# Patient Record
Sex: Female | Born: 1990 | Race: Black or African American | Hispanic: No | Marital: Single | Smoking: Never smoker
Health system: Southern US, Community
[De-identification: ages and names within clinical notes are randomized; demographics above are authoritative.]

## PROBLEM LIST (undated history)

## (undated) ENCOUNTER — Emergency Department (HOSPITAL_COMMUNITY): Admission: EM | Payer: Self-pay | Source: Home / Self Care

## (undated) DIAGNOSIS — R87629 Unspecified abnormal cytological findings in specimens from vagina: Secondary | ICD-10-CM

## (undated) DIAGNOSIS — R51 Headache: Secondary | ICD-10-CM

## (undated) HISTORY — PX: COLPOSCOPY: SHX161

## (undated) HISTORY — DX: Unspecified abnormal cytological findings in specimens from vagina: R87.629

## (undated) HISTORY — DX: Headache: R51

---
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
## 2012-01-01 ENCOUNTER — Ambulatory Visit (INDEPENDENT_AMBULATORY_CARE_PROVIDER_SITE_OTHER): Payer: BC Managed Care – PPO | Admitting: Medical

## 2012-01-01 ENCOUNTER — Encounter: Payer: Self-pay | Admitting: Medical

## 2012-01-01 VITALS — BP 100/70 | HR 60 | Temp 99.1°F | Resp 16 | Ht 63.5 in | Wt 123.0 lb

## 2012-01-01 DIAGNOSIS — Z7189 Other specified counseling: Secondary | ICD-10-CM

## 2012-01-01 DIAGNOSIS — Z23 Encounter for immunization: Secondary | ICD-10-CM

## 2012-01-01 NOTE — Progress Notes (Signed)
Subjective: Here as a new patient.  Here for Tdap shot.  Starting [employer] this fall, needs Tdap.  Mother works at a rest home, dad works with [employer].  She notes no prior primary care, no recent physical.  She has been doing well in her normal state of health.  She currently eats a lot of fast food.  She lives with her parents, and they eat out mostly, but do cook some at home which is usually fried foods.  She is not exercising.  She does report 1 sexual partner in 2011, not sexually active now.  No prior STD screening.  She did bring her vaccine records with her.  She has no other c/o.    Past Medical History  Diagnosis Date  . Headache      The following portions of the patient's history were reviewed and updated as appropriate: allergies, current medications, past family history, past medical history, past social history, past surgical history and problem list.    Allergies  Allergen Reactions  . Shellfish Allergy     hives   Review of Systems ROS reviewed and was negative other than noted in HPI or above.    Objective:   Physical Exam  General appearance: alert, no distress, WD/WN, AA female Neck: supple, no lymphadenopathy, no thyromegaly, no masses Heart: RRR, normal S1, S2, no murmurs Lungs: CTA bilaterally, no wheezes, rhonchi, or rales Pulses: 2+ symmetric, upper and lower extremities, normal cap refill   Assessment and Plan :     Encounter Diagnoses  Name Primary?  . Need for Tdap vaccination Yes  . Counseling on health promotion and disease prevention     Vaccine counseling and VIS given, Tdap given.  Vaccine info faxed to [facility] student health.  Recommended a physical, advised she exercise regularly, needs to eat healthier, discussed preventative care, STD screening, HPV and Varicella # 2 vaccines recommended.  She will return at her convenience, wants to consult with her father first on these issues.

## 2012-01-01 NOTE — Patient Instructions (Signed)
I have reviewed your vaccines.  You are up to date on all vaccines except Tdap/tetanus, HPV - human papilloma virus vaccine and your second varicella/chicken pox vaccine.    We gave you Tdap today, but consider the HPV and varicella vaccines.    Consider returning for a physical.   I recommend you find out what medical problems your family has to help guide your health.    Eat healthy, cook more of your food at home, exercise regularly, and live a healthy lifestyle.

## 2012-01-03 ENCOUNTER — Telehealth: Payer: Self-pay | Admitting: Medical

## 2012-01-03 NOTE — Telephone Encounter (Signed)
Patient called and requested to have copy of Tdap vaccine faxed to [facility] @ [phone] and include her student ID # of [id number] Fax sent

## 2013-03-09 ENCOUNTER — Encounter (HOSPITAL_COMMUNITY): Payer: Self-pay | Admitting: Emergency Medicine

## 2013-03-09 ENCOUNTER — Emergency Department (HOSPITAL_COMMUNITY)
Admission: EM | Admit: 2013-03-09 | Discharge: 2013-03-09 | Disposition: A | Discharge status: Home / Self Care | Payer: BC Managed Care – PPO | Attending: Emergency Medicine | Admitting: Emergency Medicine

## 2013-03-09 ENCOUNTER — Emergency Department (HOSPITAL_COMMUNITY): Payer: BC Managed Care – PPO

## 2013-03-09 DIAGNOSIS — S93501A Unspecified sprain of right great toe, initial encounter: Secondary | ICD-10-CM

## 2013-03-09 DIAGNOSIS — Y9389 Activity, other specified: Secondary | ICD-10-CM | POA: Insufficient documentation

## 2013-03-09 DIAGNOSIS — Y9289 Other specified places as the place of occurrence of the external cause: Secondary | ICD-10-CM | POA: Insufficient documentation

## 2013-03-09 DIAGNOSIS — Y990 Civilian activity done for income or pay: Secondary | ICD-10-CM | POA: Insufficient documentation

## 2013-03-09 DIAGNOSIS — W208XXA Other cause of strike by thrown, projected or falling object, initial encounter: Secondary | ICD-10-CM | POA: Insufficient documentation

## 2013-03-09 DIAGNOSIS — W010XXA Fall on same level from slipping, tripping and stumbling without subsequent striking against object, initial encounter: Secondary | ICD-10-CM | POA: Insufficient documentation

## 2013-03-09 DIAGNOSIS — S93609A Unspecified sprain of unspecified foot, initial encounter: Secondary | ICD-10-CM | POA: Insufficient documentation

## 2013-03-09 MED ORDER — IBUPROFEN 400 MG PO TABS
800.0000 mg | ORAL_TABLET | Freq: Once | ORAL | Status: AC
  Administered 2013-03-09: 800 mg via ORAL
  Filled 2013-03-09: qty 2

## 2013-03-09 NOTE — ED Provider Notes (Signed)
CSN: [id number]     Arrival date & time 03/09/13  0028 History   None    Chief Complaint  Patient presents with  . Toe Injury   (Consider location/radiation/quality/duration/timing/severity/associated sxs/prior Treatment) HPI History provided by pt.   Pt slipped in the cooler at work this evening and dropped a case of soda on R great toe just pta.  Has severe pain that is aggravated by ROM.  Has not attempted to bear weight.  Has not taken anything for pain.  Did not hit her head when she fell and denies pain in neck/back.  Past Medical History  Diagnosis Date  . Headache(784.0)    No past surgical history on file. No family history on file. History  Substance Use Topics  . Smoking status: Never Smoker   . Smokeless tobacco: Not on file  . Alcohol Use: No   OB History   Grav Para Term Preterm Abortions TAB SAB Ect Mult Living                 Review of Systems  All other systems reviewed and are negative.    Allergies  Shellfish allergy  Home Medications  No current outpatient prescriptions on file. BP 115/79  Pulse 65  Temp(Src) 99.2 F (37.3 C) (Oral)  Resp 18  SpO2 100% Physical Exam  Nursing note and vitals reviewed. Constitutional: She is oriented to person, place, and time. She appears well-developed and well-nourished. No distress.  HENT:  Head: Normocephalic and atraumatic.  Eyes:  Normal appearance  Neck: Normal range of motion.  Pulmonary/Chest: Effort normal.  Musculoskeletal: Normal range of motion.  R great toe w/out deformity, edema or ecchymosis.  Nml nail. Tenderness of entire toe and pain w/ passive ROM MTP and interphalangeal joints.  Brisk cap refill and distal sensation intact.  Nml foot and rest of toes.  Neurological: She is alert and oriented to person, place, and time.  Psychiatric: She has a normal mood and affect. Her behavior is normal.    ED Course  Procedures (including critical care time) Labs Review Labs Reviewed - No data  to display Imaging Review Dg Toe Great Right  03/09/2013   CLINICAL DATA:  Toe injury  EXAM: RIGHT GREAT TOE  COMPARISON:  None.  FINDINGS: There is no evidence of fracture or dislocation. There is no evidence of arthropathy or other focal bone abnormality. Soft tissues are unremarkable.  IMPRESSION: Negative.   Electronically Signed   By: Tiburcio Pea M.D.   On: 03/09/2013 01:51    EKG Interpretation   None       MDM   1. Sprain of right great toe, initial encounter    Healthy 22yo F presents w/ R great toe injury.  Xray negative and NV intact on exam.  Nursing staff buddy-taped and provided her w/ post-op shoe.  2:07 AM     Otilio Miu, PA-C 03/09/13 0207  Otilio Miu, PA-C 03/09/13 [id number]

## 2013-03-09 NOTE — ED Notes (Signed)
Pt dropped a case of mountain dew on her toe. Toe swollen. Sensation present. Cap refill less than 3 seconds.

## 2013-03-09 NOTE — ED Notes (Signed)
Ice pack placed on right big toe

## 2013-03-09 NOTE — ED Notes (Signed)
Pt dc to home. Pt sts understanding to dc instructions. Pt ambulatory to exit without difficulty.  Pt denies need for w/c.  

## 2013-03-09 NOTE — ED Provider Notes (Signed)
Medical screening examination/treatment/procedure(s) were performed by non-physician practitioner and as supervising physician I was immediately available for consultation/collaboration.   Dione Booze, MD 03/09/13 [phone]

## 2013-10-09 ENCOUNTER — Encounter: Payer: Self-pay | Admitting: Obstetrics & Gynecology

## 2013-10-09 ENCOUNTER — Ambulatory Visit (INDEPENDENT_AMBULATORY_CARE_PROVIDER_SITE_OTHER): Payer: BC Managed Care – PPO | Admitting: Obstetrics & Gynecology

## 2013-10-09 VITALS — BP 93/57 | HR 57 | Ht 65.0 in | Wt 128.0 lb

## 2013-10-09 DIAGNOSIS — Z23 Encounter for immunization: Secondary | ICD-10-CM

## 2013-10-09 DIAGNOSIS — Z0000 Encounter for general adult medical examination without abnormal findings: Secondary | ICD-10-CM

## 2013-10-09 DIAGNOSIS — Z113 Encounter for screening for infections with a predominantly sexual mode of transmission: Secondary | ICD-10-CM

## 2013-10-09 DIAGNOSIS — Z3009 Encounter for other general counseling and advice on contraception: Secondary | ICD-10-CM

## 2013-10-09 MED ORDER — NORGESTIMATE-ETH ESTRADIOL 0.25-35 MG-MCG PO TABS: 1.0000 | ORAL_TABLET | Freq: Every day | ORAL | Status: DC

## 2013-10-09 NOTE — Progress Notes (Signed)
   Subjective:    Patient ID: Alyssa Long, female    DOB: [birth year], 23 y.o.   MRN: [id number]  HPI 3120 S AA G1P0A1 here today to discuss contraception. She lost virginity at 23 yo, 3 partner in lifetime. She denies h/o STIs.   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 She opts for OCPs. I prescribed generic sprintec. RTC 4 weeks for BP check

## 2013-10-10 LAB — RPR

## 2013-10-10 LAB — GC/CHLAMYDIA PROBE AMP
CT PROBE, AMP APTIMA: NEGATIVE
GC PROBE AMP APTIMA: NEGATIVE

## 2013-10-10 LAB — HEPATITIS C ANTIBODY: HCV AB: NEGATIVE

## 2013-10-10 LAB — HIV ANTIBODY (ROUTINE TESTING W REFLEX): HIV 1&2 Ab, 4th Generation: NONREACTIVE

## 2013-10-10 LAB — HEPATITIS B SURFACE ANTIGEN: HEP B S AG: NEGATIVE

## 2013-10-23 ENCOUNTER — Encounter: Payer: Self-pay | Admitting: Obstetrics & Gynecology

## 2013-11-12 ENCOUNTER — Encounter: Payer: BC Managed Care – PPO | Admitting: *Deleted

## 2013-11-12 ENCOUNTER — Encounter: Payer: Self-pay | Admitting: *Deleted

## 2013-11-12 NOTE — Progress Notes (Signed)
Pt came in today for a blood pressure reading due to starting OCP.  Patients blood pressure is good today.

## 2013-11-27 ENCOUNTER — Emergency Department (HOSPITAL_COMMUNITY)
Admission: EM | Admit: 2013-11-27 | Discharge: 2013-11-27 | Disposition: A | Discharge status: Home / Self Care | Payer: BC Managed Care – PPO | Attending: Emergency Medicine | Admitting: Emergency Medicine

## 2013-11-27 ENCOUNTER — Encounter (HOSPITAL_COMMUNITY): Payer: Self-pay | Admitting: Emergency Medicine

## 2013-11-27 DIAGNOSIS — H00016 Hordeolum externum left eye, unspecified eyelid: Secondary | ICD-10-CM

## 2013-11-27 DIAGNOSIS — H00019 Hordeolum externum unspecified eye, unspecified eyelid: Secondary | ICD-10-CM | POA: Insufficient documentation

## 2013-11-27 DIAGNOSIS — Z79899 Other long term (current) drug therapy: Secondary | ICD-10-CM | POA: Insufficient documentation

## 2013-11-27 MED ORDER — FLUORESCEIN SODIUM 1 MG OP STRP
1.0000 | ORAL_STRIP | Freq: Once | OPHTHALMIC | Status: AC
  Administered 2013-11-27: 21:00:00 via OPHTHALMIC
  Filled 2013-11-27: qty 1

## 2013-11-27 MED ORDER — TETRACAINE HCL 0.5 % OP SOLN
1.0000 [drp] | Freq: Once | OPHTHALMIC | Status: AC
  Administered 2013-11-27: 1 [drp] via OPHTHALMIC
  Filled 2013-11-27: qty 2

## 2013-11-27 MED ORDER — ERYTHROMYCIN 5 MG/GM OP OINT
1.0000 "application " | TOPICAL_OINTMENT | Freq: Once | OPHTHALMIC | Status: AC
  Administered 2013-11-27: 1 via OPHTHALMIC
  Filled 2013-11-27: qty 1

## 2013-11-27 NOTE — ED Notes (Signed)
Patient states during visual acuity that she is suppose to have eye glasses but does not have any. PA made aware.

## 2013-11-27 NOTE — ED Notes (Signed)
Pt with "bump" to inside of left lower eyelid since Tuesday. Denies blurred vision. Pt reports pain only when blinking.

## 2013-11-27 NOTE — ED Notes (Signed)
Patient was discharged with all personal items. 

## 2013-11-27 NOTE — ED Provider Notes (Signed)
CSN: [id number]     Arrival date & time 11/27/13  2005 History   None    This chart was scribed for non-physician practitioner, Junious SilkHannah Traci Gafford PA-C, working with Ward GivensIva L Knapp, MD by Arlan OrganAshley Leger, ED Scribe. This patient was seen in room TR04C/TR04C and the patient's care was started at 9:15 PM.   Chief Complaint  Patient presents with  . Eye Problem   The history is provided by the patient. No language interpreter was used.    HPI Comments: Alyssa Long is a 23 y.o. female who presents to the Emergency Department complaining of moderate L eye swelling x 2 days that is unchanged. Pt also reports watering of the eye and mild pain. Discomfort is worsened with blinking. No alleviating factors at this time. She has not tried anything OTC or any home remedies to improve symptoms. At this time she denies any fever, chills, photophobia, eye drainage, visual disturbances, or eye itching. She has no pertinent past medical history. No other concerns this visit.   Past Medical History  Diagnosis Date  . Headache(784.0)    History reviewed. No pertinent past surgical history. History reviewed. No pertinent family history. History  Substance Use Topics  . Smoking status: Never Smoker   . Smokeless tobacco: Not on file  . Alcohol Use: No   OB History   Grav Para Term Preterm Abortions TAB SAB Ect Mult Living                 Review of Systems  Constitutional: Negative for fever and chills.  Eyes: Positive for pain (L eye). Negative for photophobia, discharge, redness, itching and visual disturbance.  All other systems reviewed and are negative.     Allergies  Shellfish allergy  Home Medications   Prior to Admission medications   Medication Sig Start Date End Date Taking? Authorizing Provider  norgestimate-ethinyl estradiol (ORTHO-CYCLEN,SPRINTEC,PREVIFEM) 0.25-35 MG-MCG tablet Take 1 tablet by mouth daily. 10/09/13   Allie BossierMyra C Dove, MD   Triage Vitals: BP 117/74  Pulse 82  Temp(Src)  99.1 F (37.3 C) (Oral)  Resp 16  SpO2 100%   Physical Exam  Nursing note and vitals reviewed. Constitutional: She is oriented to person, place, and time. She appears well-developed and well-nourished.  HENT:  Head: Normocephalic.  Eyes: Conjunctivae and EOM are normal. Pupils are equal, round, and reactive to light. Right eye exhibits no hordeolum. Left eye exhibits hordeolum. Left eye exhibits no discharge and no exudate. No foreign body present in the left eye. Right conjunctiva is not injected. Right conjunctiva has no hemorrhage. Left conjunctiva is not injected. Left conjunctiva has no hemorrhage.  Slit lamp exam:      The left eye shows no corneal abrasion, no corneal flare, no corneal ulcer, no foreign body, no hyphema, no hypopyon and no fluorescein uptake.  Stye to lower, inner eyelid  Neck: Normal range of motion.  Cardiovascular: Normal rate, regular rhythm and normal heart sounds.   Pulmonary/Chest: Effort normal.  Abdominal: She exhibits no distension.  Musculoskeletal: Normal range of motion.  Neurological: She is alert and oriented to person, place, and time.  Psychiatric: She has a normal mood and affect.    ED Course  Procedures (including critical care time)  DIAGNOSTIC STUDIES: Oxygen Saturation is 100% on RA, Normal by my interpretation.    COORDINATION OF CARE: 9:17 PM- Will give an antibiotic ointment at discharge. Discussed treatment plan with pt at bedside and pt agreed to plan.  Labs Review Labs Reviewed - No data to display  Imaging Review No results found.   EKG Interpretation None      MDM   Final diagnoses:  Stye, left   Patient presents to ED with stye to left inner lid. Visual acuity equal bilaterally. No fluorescein dye uptake. Patient will be discharged with erythromycin ointment and opthalmology follow up if her symptoms worsen in any way. Encouraged to use warm compresses on eye. Discussed reasons to return to ED immediately.  Vital signs stable for discharge. Patient / Family / Caregiver informed of clinical course, understand medical decision-making process, and agree with plan.  I personally performed the services described in this documentation, which was scribed in my presence. The recorded information has been reviewed and is accurate.    Mora Bellman, PA-C 11/29/13 0236

## 2013-11-27 NOTE — Discharge Instructions (Signed)

## 2013-12-02 NOTE — ED Provider Notes (Signed)
Medical screening examination/treatment/procedure(s) were performed by non-physician practitioner and as supervising physician I was immediately available for consultation/collaboration.   EKG Interpretation None      Devoria AlbeIva Mikael Skoda, MD, Armando GangFACEP   Ward GivensIva L Claudius Mich, MD 12/02/13 2042

## 2013-12-03 NOTE — Progress Notes (Signed)
This encounter was created in error - please disregard.

## 2013-12-23 ENCOUNTER — Encounter (HOSPITAL_COMMUNITY): Payer: Self-pay | Admitting: Emergency Medicine

## 2013-12-23 ENCOUNTER — Emergency Department (HOSPITAL_COMMUNITY)
Admission: EM | Admit: 2013-12-23 | Discharge: 2013-12-23 | Disposition: A | Discharge status: Home / Self Care | Payer: BC Managed Care – PPO | Attending: Emergency Medicine | Admitting: Emergency Medicine

## 2013-12-23 DIAGNOSIS — L03119 Cellulitis of unspecified part of limb: Principal | ICD-10-CM

## 2013-12-23 DIAGNOSIS — L02419 Cutaneous abscess of limb, unspecified: Secondary | ICD-10-CM | POA: Insufficient documentation

## 2013-12-23 DIAGNOSIS — L0291 Cutaneous abscess, unspecified: Secondary | ICD-10-CM

## 2013-12-23 DIAGNOSIS — L989 Disorder of the skin and subcutaneous tissue, unspecified: Secondary | ICD-10-CM | POA: Insufficient documentation

## 2013-12-23 NOTE — ED Provider Notes (Signed)
CSN: [id number]     Arrival date & time 12/23/13  2020 History  This chart was scribed for non-physician practitioner, Earley FavorGail Kerah Hardebeck, FNP,working with Rolland PorterMark James, MD, by Karle PlumberJennifer Tensley, ED Scribe. This patient was seen in room WTR6/WTR6 and the patient's care was started at 9:20 PM.  Chief Complaint  Patient presents with  . Skin Problem   The history is provided by the patient. No language interpreter was used.   HPI Comments:  Alyssa Long is a 23 y.o. female who presents to the Emergency Department complaining of a raised area to her right inner thigh that she noticed three days ago. She reports sore throat, generalized muscle aches and subjective fever that have since resolved since onset three days ago. She states she has not taken anything for pain or done anything for her symptoms. Pt reports h/o previous abscesses that have had to be incised and drained in the past. She denies drainage, current fever, numbness or tingling. Pt denies allergies to any medications. She states she takes oral contraceptives.  History reviewed. No pertinent past medical history. History reviewed. No pertinent past surgical history. History reviewed. No pertinent family history. History  Substance Use Topics  . Smoking status: Never Smoker   . Smokeless tobacco: Not on file  . Alcohol Use: No   OB History   Grav Para Term Preterm Abortions TAB SAB Ect Mult Living                 Review of Systems  Constitutional: Negative for fever and chills.  Musculoskeletal: Negative for myalgias.  Skin:       Abscess to inner right thigh.  Neurological: Negative for numbness.  All other systems reviewed and are negative.   Allergies  Review of patient's allergies indicates no known allergies.  Home Medications   Prior to Admission medications   Not on File   Triage Vitals: BP 105/61  Pulse 80  Temp(Src) 99 F (37.2 C) (Oral)  Resp 16  SpO2 100%  LMP 12/16/2013 Physical Exam  Nursing note and  vitals reviewed. Constitutional: She is oriented to person, place, and time. She appears well-developed and well-nourished.  HENT:  Head: Normocephalic and atraumatic.  Eyes: EOM are normal.  Neck: Normal range of motion.  Cardiovascular: Normal rate.   Pulmonary/Chest: Effort normal.  Musculoskeletal: Normal range of motion.  Neurological: She is alert and oriented to person, place, and time.  Skin: Skin is warm and dry. There is erythema.  1 cm firm area with domed pustule with 2 cm of erythema without induration.  Psychiatric: She has a normal mood and affect. Her behavior is normal.    ED Course  Procedures (including critical care time) DIAGNOSTIC STUDIES: Oxygen Saturation is 100% on RA, normal by my interpretation.   COORDINATION OF CARE: 9:24 PM- Will I & D abscess. Pt verbalizes understanding and agrees to plan.  INCISION AND DRAINAGE PROCEDURE NOTE: Patient identification was confirmed and verbal consent was obtained. This procedure was performed by Earley FavorGail Beck Cofer, FNP at 9:26 PM. Site: right medial thigh Sterile procedures observed Needle size: 27 G Anesthetic used (type and amt): Lidocaine 2% with Epinephrine (2 mLs) Blade size: 11 Drainage: small  Complexity: Complex Packing used: none Site anesthetized, incision made over site, wound drained and explored loculations, rinsed with copious amounts of normal saline, wound packed with sterile gauze, covered with dry, sterile dressing.  Pt tolerated procedure well without complications.  Instructions for care discussed verbally and pt provided with  additional written instructions for homecare and f/u.  Medications - No data to display  Labs Review Labs Reviewed - No data to display  Imaging Review No results found.   EKG Interpretation None      MDM   Final diagnoses:  Abscess      I personally performed the services described in this documentation, which was scribed in my presence. The recorded  information has been reviewed and is accurate.    Arman Filter, NP 12/23/13 2158

## 2013-12-23 NOTE — Discharge Instructions (Signed)

## 2013-12-23 NOTE — ED Notes (Signed)
Patient states she notice raised area to her right inner thigh. She denies drainage in the area, but she says she experience general muscle aches and fever.

## 2013-12-25 NOTE — ED Provider Notes (Signed)
Medical screening examination/treatment/procedure(s) were performed by non-physician practitioner and as supervising physician I was immediately available for consultation/collaboration.   EKG Interpretation None        Rolland PorterMark Motty Borin, MD 12/25/13 [phone]

## 2014-12-29 ENCOUNTER — Inpatient Hospital Stay (HOSPITAL_COMMUNITY)
Admission: AD | Admit: 2014-12-29 | Discharge: 2014-12-30 | Disposition: A | Discharge status: Home / Self Care | Payer: Self-pay | Source: Ambulatory Visit | Attending: Obstetrics & Gynecology | Admitting: Obstetrics & Gynecology

## 2014-12-29 ENCOUNTER — Encounter (HOSPITAL_COMMUNITY): Payer: Self-pay | Admitting: *Deleted

## 2014-12-29 DIAGNOSIS — B9689 Other specified bacterial agents as the cause of diseases classified elsewhere: Secondary | ICD-10-CM

## 2014-12-29 DIAGNOSIS — N760 Acute vaginitis: Secondary | ICD-10-CM | POA: Insufficient documentation

## 2014-12-29 DIAGNOSIS — Z3202 Encounter for pregnancy test, result negative: Secondary | ICD-10-CM | POA: Insufficient documentation

## 2014-12-29 LAB — URINALYSIS, ROUTINE W REFLEX MICROSCOPIC
BILIRUBIN URINE: NEGATIVE
Glucose, UA: NEGATIVE mg/dL
Hgb urine dipstick: NEGATIVE
Ketones, ur: NEGATIVE mg/dL
Nitrite: POSITIVE — AB
PH: 7 (ref 5.0–8.0)
PROTEIN: NEGATIVE mg/dL
SPECIFIC GRAVITY, URINE: 1.015 (ref 1.005–1.030)
Urobilinogen, UA: 1 mg/dL (ref 0.0–1.0)

## 2014-12-29 LAB — URINE MICROSCOPIC-ADD ON

## 2014-12-29 LAB — POCT PREGNANCY, URINE: Preg Test, Ur: NEGATIVE

## 2014-12-29 NOTE — MAU Note (Signed)
PT SAYS  SOMEONE  TOLD  HER   THAT  SHE  SMELT  BAD-  AND HAD VAG  ODOR.   SAYS SHE  HAD NL VAG  D/C.

## 2014-12-30 DIAGNOSIS — A499 Bacterial infection, unspecified: Secondary | ICD-10-CM

## 2014-12-30 DIAGNOSIS — N760 Acute vaginitis: Secondary | ICD-10-CM

## 2014-12-30 LAB — WET PREP, GENITAL
TRICH WET PREP: NONE SEEN
YEAST WET PREP: NONE SEEN

## 2014-12-30 LAB — GC/CHLAMYDIA PROBE AMP (~~LOC~~) NOT AT ARMC
Chlamydia: NEGATIVE
NEISSERIA GONORRHEA: NEGATIVE

## 2014-12-30 LAB — HIV ANTIBODY (ROUTINE TESTING W REFLEX): HIV SCREEN 4TH GENERATION: NONREACTIVE

## 2014-12-30 NOTE — MAU Provider Note (Signed)
Chief Complaint: No chief complaint on file.   None     SUBJECTIVE HPI: Alyssa Long is a 24 y.o. G2P0020  who presents to maternity admissions reporting vaginal odor.  She reports she does not notice an odor but friends have commented that she has an odor and should get checked out.  She denies abdominal pain, vaginal bleeding, vaginal itching/burning, urinary symptoms, h/a, dizziness, n/v, or fever/chills.     HPI  Past Medical History  Diagnosis Date  . Headache(784.0)    History reviewed. No pertinent past surgical history. Social History   Social History  . Marital Status: Single    Spouse Name: N/A  . Number of Children: N/A  . Years of Education: N/A   Occupational History  . Not on file.   Social History Main Topics  . Smoking status: Never Smoker   . Smokeless tobacco: Not on file  . Alcohol Use: 0.6 oz/week    1 Glasses of wine per week  . Drug Use: No  . Sexual Activity:    Partners: Male    Birth Control/ Protection: None   Other Topics Concern  . Not on file   Social History Narrative   No current facility-administered medications on file prior to encounter.   Current Outpatient Prescriptions on File Prior to Encounter  Medication Sig Dispense Refill  . norgestimate-ethinyl estradiol (ORTHO-CYCLEN,SPRINTEC,PREVIFEM) 0.25-35 MG-MCG tablet Take 1 tablet by mouth daily. 1 Package 11   Allergies  Allergen Reactions  . Shellfish Allergy     hives    ROS:  Review of Systems  Constitutional: Negative for fever, chills and fatigue.  HENT: Negative for sinus pressure.   Eyes: Negative for photophobia.  Respiratory: Negative for shortness of breath.   Cardiovascular: Negative for chest pain.  Gastrointestinal: Positive for nausea. Negative for vomiting and abdominal pain.  Genitourinary: Negative for dysuria, frequency, flank pain, vaginal bleeding, vaginal discharge, difficulty urinating, vaginal pain and pelvic pain.  Musculoskeletal: Negative  for neck pain.  Neurological: Positive for dizziness and headaches. Negative for weakness.  Psychiatric/Behavioral: Negative.      I have reviewed patient's Past Medical Hx, Surgical Hx, Family Hx, Social Hx, medications and allergies.   Physical Exam   Patient Vitals for the past 24 hrs:  BP Temp Temp src Pulse Resp Height Weight  12/29/14 2225 130/78 mmHg 98.3 F (36.8 C) Oral 72 20  (1.575 m) 57.323 kg (126 lb 6 oz)   Constitutional: Well-developed, well-nourished female in no acute distress.  Cardiovascular: normal rate Respiratory: normal effort GI: Abd soft, non-tender, gravid appropriate for gestational age. Pos BS x 4 MS: Extremities nontender, no edema, normal ROM Neurologic: Alert and oriented x 4.  GU: Neg CVAT.  PELVIC EXAM: Cervix pink, visually closed, friable cervix, without lesion, scant white thin discharge, vaginal walls and external genitalia normal Bimanual exam: Cervix 0/long/high, firm, anterior, neg CMT, uterus nontender, nonenlarged, adnexa without tenderness, enlargement, or mass   LAB RESULTS Results for orders placed or performed during the hospital encounter of 12/29/14 (from the past 24 hour(s))  Urinalysis, Routine w reflex microscopic (not at [facility])     Status: Abnormal   Collection Time: 12/29/14 10:30 PM  Result Value Ref Range   Color, Urine YELLOW YELLOW   APPearance CLEAR CLEAR   Specific Gravity, Urine 1.015 1.005 - 1.030   pH 7.0 5.0 - 8.0   Glucose, UA NEGATIVE NEGATIVE mg/dL   Hgb urine dipstick NEGATIVE NEGATIVE   Bilirubin Urine NEGATIVE  NEGATIVE   Ketones, ur NEGATIVE NEGATIVE mg/dL   Protein, ur NEGATIVE NEGATIVE mg/dL   Urobilinogen, UA 1.0 0.0 - 1.0 mg/dL   Nitrite POSITIVE (A) NEGATIVE   Leukocytes, UA SMALL (A) NEGATIVE  Urine microscopic-add on     Status: Abnormal   Collection Time: 12/29/14 10:30 PM  Result Value Ref Range   Squamous Epithelial / LPF FEW (A) RARE   WBC, UA 3-6 <3 WBC/hpf   RBC / HPF 0-2 <3 RBC/hpf    Bacteria, UA MANY (A) RARE  Pregnancy, urine POC     Status: None   Collection Time: 12/29/14 10:41 PM  Result Value Ref Range   Preg Test, Ur NEGATIVE NEGATIVE  Wet prep, genital     Status: Abnormal   Collection Time: 12/30/14 12:44 AM  Result Value Ref Range   Yeast Wet Prep HPF POC NONE SEEN NONE SEEN   Trich, Wet Prep NONE SEEN NONE SEEN   Clue Cells Wet Prep HPF POC FEW (A) NONE SEEN   WBC, Wet Prep HPF POC FEW (A) NONE SEEN      ASSESSMENT 1. Bacterial vaginosis     PLAN GC culture pending Urine culture pending Discharge home Flagyl 500 mg BID x 7 days Recommend routine Gyn care/Pap. Pt given contact information for providers. Return to MAU as needed for emergencies    Medication List    TAKE these medications        norgestimate-ethinyl estradiol 0.25-35 MG-MCG tablet  Commonly known as:  ORTHO-CYCLEN,SPRINTEC,PREVIFEM  Take 1 tablet by mouth daily.         Sharen Counter Certified Nurse-Midwife 12/30/2014  2:49 AM

## 2015-01-01 LAB — URINE CULTURE: Culture: >=100000

## 2015-01-02 ENCOUNTER — Telehealth: Payer: Self-pay | Admitting: Physician Assistant

## 2015-01-02 DIAGNOSIS — N390 Urinary tract infection, site not specified: Secondary | ICD-10-CM | POA: Insufficient documentation

## 2015-01-02 MED ORDER — CIPROFLOXACIN HCL 500 MG PO TABS: 500.0000 mg | ORAL_TABLET | Freq: Two times a day (BID) | ORAL | Status: DC

## 2015-01-02 NOTE — Telephone Encounter (Signed)
Urine culture results indicate UTI in non-pregnant female, sensitive to Cipro.   Pt called.  Identify confirmed x 2.  Pt informed of UTI and need for treatment.  She confirms no allergies.  Desires pharmacy changed to [facility] at [address].  Rx for Cipro sent.  All questions answered.

## 2015-02-24 IMAGING — CR DG TOE GREAT 2+V*R*
3 series · 3 of 3 positions shown · non-contrast
Comparison: None.

CLINICAL DATA: Toe injury

EXAM:
RIGHT GREAT TOE

[t toes ap right]
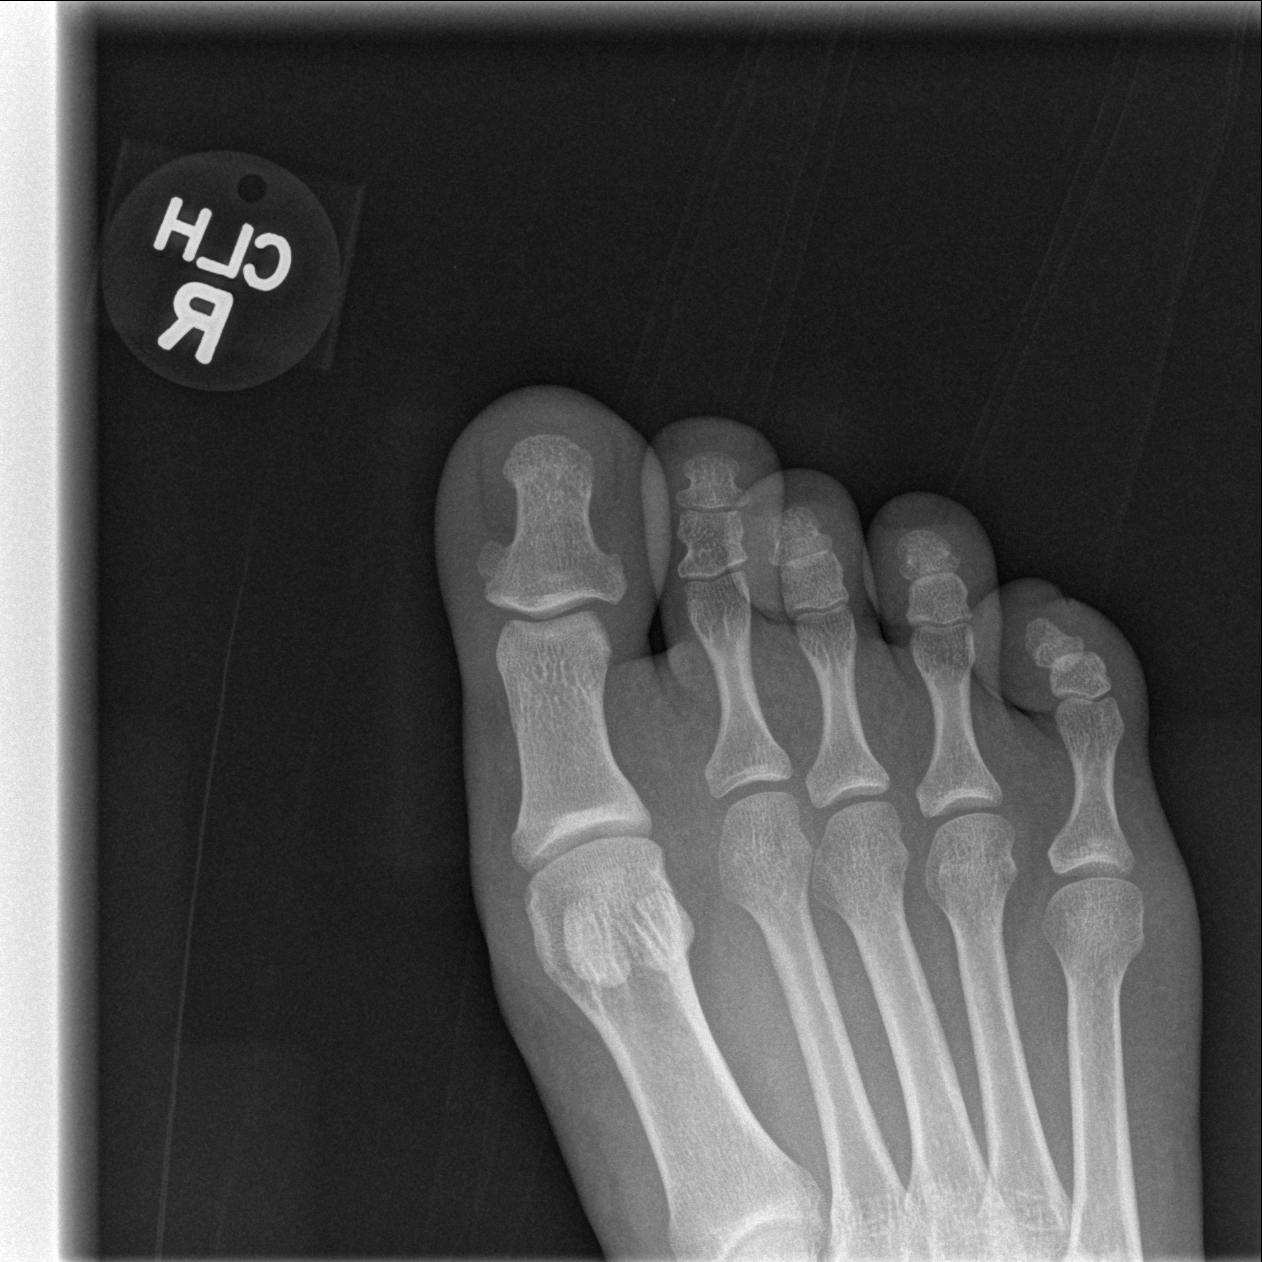

[t toes oblique right]
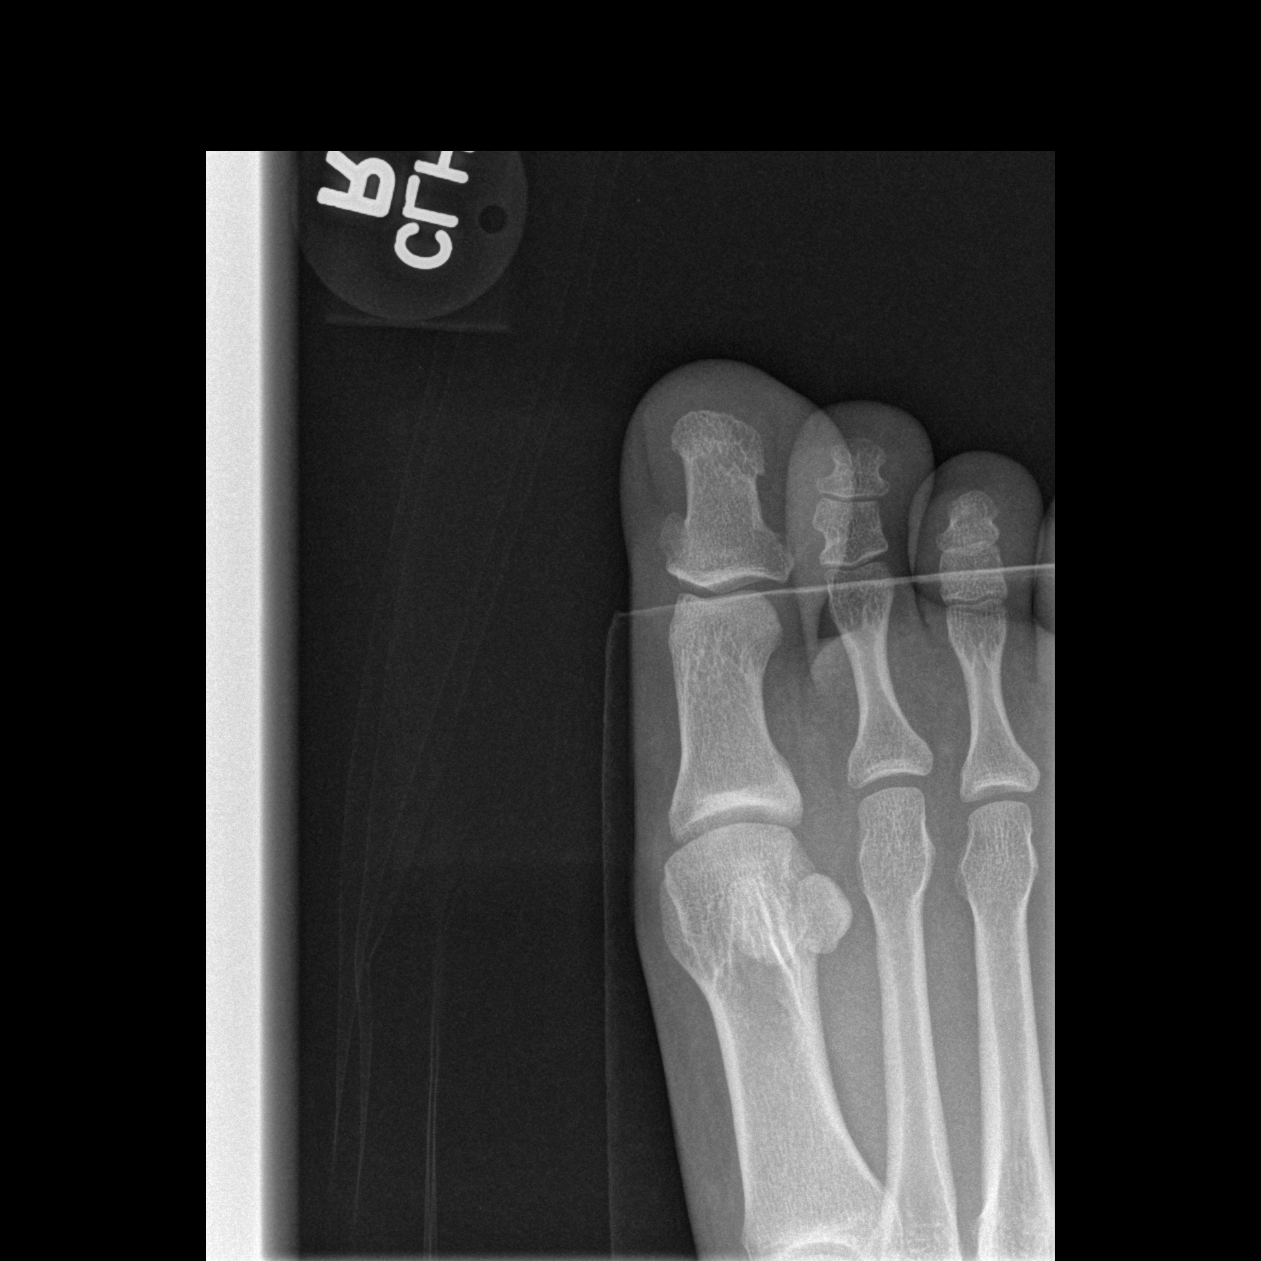

[t toes lateral right]
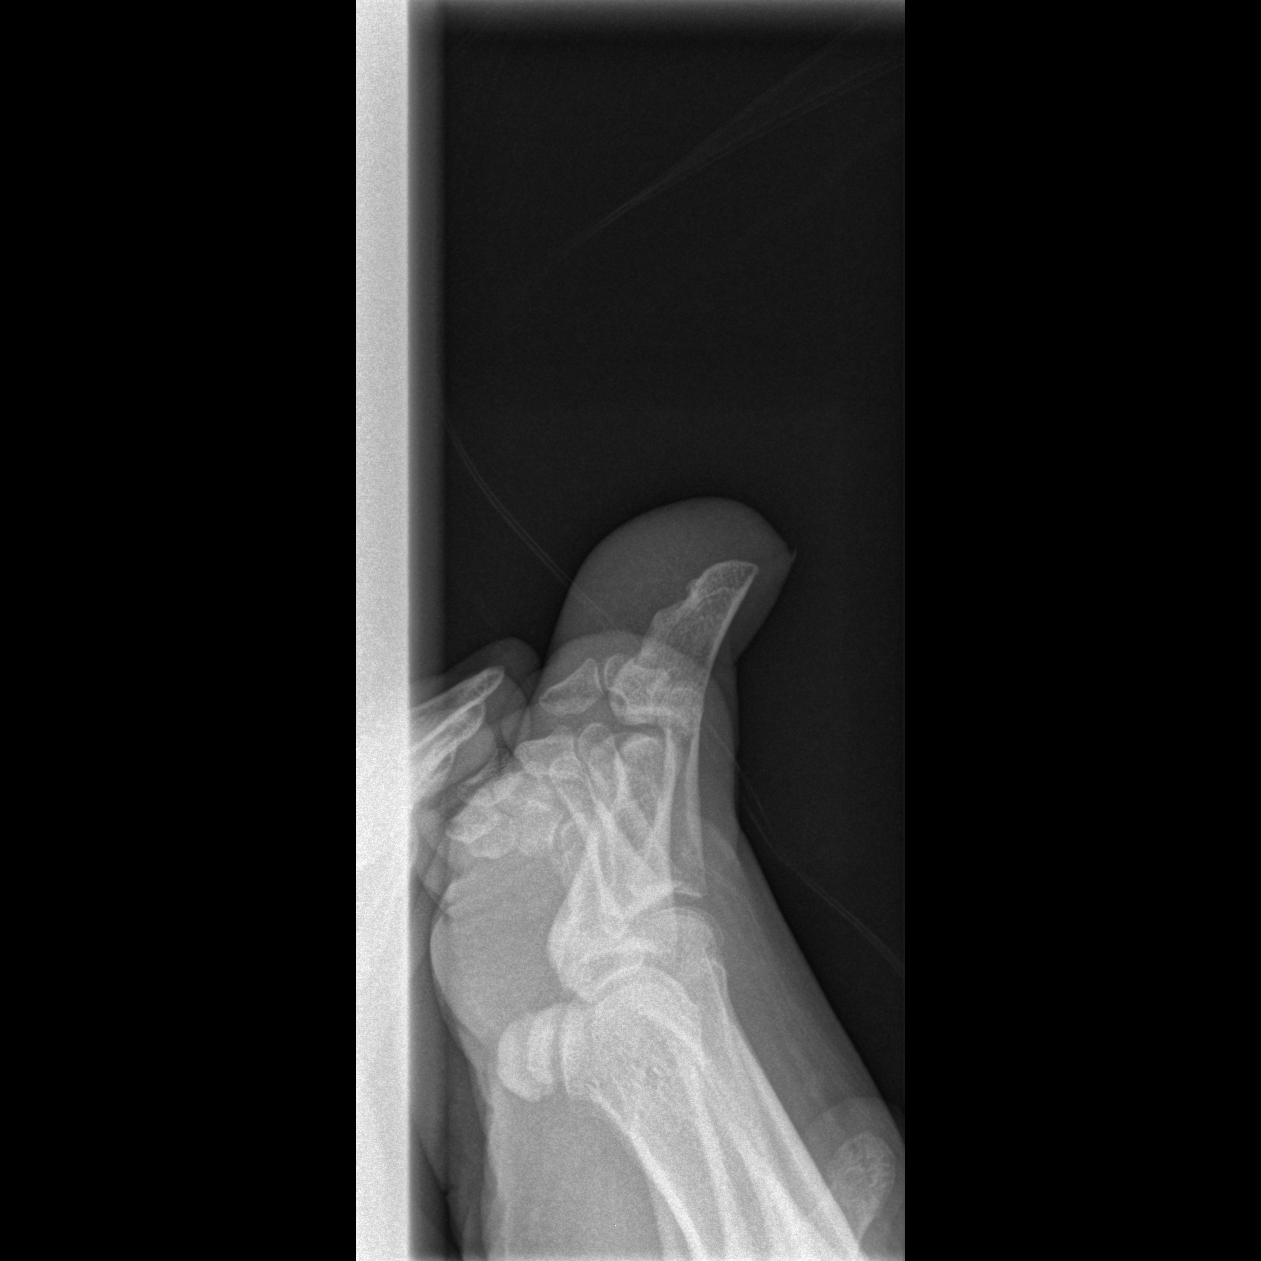

[3 of 3 positions shown; findings below may reference images not displayed]

FINDINGS: There is no evidence of fracture or dislocation. There is no
evidence of arthropathy or other focal bone abnormality. Soft
tissues are unremarkable.
IMPRESSION: Negative.

## 2016-02-14 ENCOUNTER — Telehealth: Payer: Self-pay | Admitting: Medical

## 2016-02-14 ENCOUNTER — Ambulatory Visit (HOSPITAL_COMMUNITY)
Admission: EM | Admit: 2016-02-14 | Discharge: 2016-02-14 | Disposition: A | Discharge status: Home / Self Care | Payer: BLUE CROSS/BLUE SHIELD | Attending: Family Medicine | Admitting: Family Medicine

## 2016-02-14 ENCOUNTER — Encounter (HOSPITAL_COMMUNITY): Payer: Self-pay | Admitting: Emergency Medicine

## 2016-02-14 DIAGNOSIS — Z202 Contact with and (suspected) exposure to infections with a predominantly sexual mode of transmission: Secondary | ICD-10-CM | POA: Diagnosis not present

## 2016-02-14 DIAGNOSIS — Z91013 Allergy to seafood: Secondary | ICD-10-CM | POA: Diagnosis not present

## 2016-02-14 DIAGNOSIS — Z30012 Encounter for prescription of emergency contraception: Secondary | ICD-10-CM | POA: Diagnosis not present

## 2016-02-14 DIAGNOSIS — Z79899 Other long term (current) drug therapy: Secondary | ICD-10-CM | POA: Insufficient documentation

## 2016-02-14 DIAGNOSIS — Z3009 Encounter for other general counseling and advice on contraception: Secondary | ICD-10-CM | POA: Insufficient documentation

## 2016-02-14 LAB — POCT PREGNANCY, URINE: Preg Test, Ur: NEGATIVE

## 2016-02-14 MED ORDER — MEDROXYPROGESTERONE ACETATE 150 MG/ML IM SUSP
150.0000 mg | Freq: Once | INTRAMUSCULAR | Status: AC
  Administered 2016-02-14: 150 mg via INTRAMUSCULAR
  Filled 2016-02-14: qty 1

## 2016-02-14 NOTE — Telephone Encounter (Signed)
She hasn't been here since 2013.  Please either remove me as PCP or get her in for physical

## 2016-02-14 NOTE — Discharge Instructions (Signed)
You will need another Depo-Medrol shot in 3 months. Please make an appointment with Dr.Mody at [phone].

## 2016-02-14 NOTE — ED Provider Notes (Signed)
[facility]    CSN: [id number] Arrival date & time: 02/14/16  1356  First Provider Contact:  First MD Initiated Contact with Patient 02/14/16 1453        History   Chief Complaint Chief Complaint  Patient presents with  . Contraception  . Exposure to STD    HPI Alyssa Long is a 25 y.o. female.   Is a 25 year old woman is here for an STD check. She is on her period now. Patient's been on oral contraceptives in the past but would like to try Depo-Provera now.      Past Medical History:  Diagnosis Date  . [id number](478.2)     Patient Active Problem List   Diagnosis Date Noted  . UTI (lower urinary tract infection) 01/02/2015    History reviewed. No pertinent surgical history.  OB History    Gravida Para Term Preterm AB Living   2       2     SAB TAB Ectopic Multiple Live Births     2             Home Medications    Prior to Admission medications   Medication Sig Start Date End Date Taking? Authorizing Provider  norgestimate-ethinyl estradiol (ORTHO-CYCLEN,SPRINTEC,PREVIFEM) 0.25-35 MG-MCG tablet Take 1 tablet by mouth daily. 10/09/13   Allie Bossier, MD    Family History No family history on file.  Social History Social History  Substance Use Topics  . Smoking status: Never Smoker  . Smokeless tobacco: Not on file  . Alcohol use 0.6 oz/week    1 Glasses of wine per week     Allergies   Shellfish allergy   Review of Systems Review of Systems  Constitutional: Negative.   HENT: Negative.   Eyes: Negative.   Respiratory: Negative.   Genitourinary: Negative.      Physical Exam Triage Vital Signs ED Triage Vitals [02/14/16 1442]  Enc Vitals Group     BP 113/66     Pulse Rate 75     Resp 12     Temp 98.3 F (36.8 C)     Temp Source Oral     SpO2 100 %     Weight      Height      Head Circumference      Peak Flow      Pain Score      Pain Loc      Pain Edu?      Excl. in GC?    No data found.   Updated Vital  Signs BP 113/66 (BP Location: Left Arm)   Pulse 75   Temp 98.3 F (36.8 C) (Oral)   Resp 12   LMP 02/12/2016   SpO2 100%     Physical Exam  Constitutional: She appears well-developed and well-nourished.  HENT:  Head: Normocephalic.  Right Ear: External ear normal.  Left Ear: External ear normal.  Mouth/Throat: Oropharynx is clear and moist.  Eyes: Conjunctivae and EOM are normal. Pupils are equal, round, and reactive to light.  Neck: Normal range of motion. Neck supple.  Genitourinary: Vagina normal and uterus normal.  Genitourinary Comments: Scant menstrual blood in vault  Nursing note and vitals reviewed.    UC Treatments / Results  Labs (all labs ordered are listed, but only abnormal results are displayed) Labs Reviewed  RPR  HIV ANTIBODY (ROUTINE TESTING)  POCT PREGNANCY, URINE  CERVICOVAGINAL ANCILLARY ONLY    EKG  EKG Interpretation None  Radiology No results found.  Procedures Procedures (including critical care time)  Medications Ordered in UC Medications  medroxyPROGESTERone (DEPO-PROVERA) injection 150 mg (not administered)     Initial Impression / Assessment and Plan / UC Course  I have reviewed the triage vital signs and the nursing notes.  Pertinent labs & imaging results that were available during my care of the patient were reviewed by me and considered in my medical decision making (see chart for details).  Clinical Course     Final Clinical Impressions(s) / UC Diagnoses   Final diagnoses:  Exposure to STD  Emergency contraceptive counseling and treatment    New Prescriptions Current Discharge Medication List    Depo-Medrol 150 mg IM initiated today   Elvina SidleKurt Cassius Cullinane, MD 02/14/16 1647

## 2016-02-14 NOTE — ED Triage Notes (Signed)
Patient is in department today for std testing and wants to start birth control

## 2016-02-15 LAB — CERVICOVAGINAL ANCILLARY ONLY
Chlamydia: NEGATIVE
Neisseria Gonorrhea: NEGATIVE
Wet Prep (BD Affirm): NEGATIVE

## 2016-02-15 LAB — HIV ANTIBODY (ROUTINE TESTING W REFLEX): HIV Screen 4th Generation wRfx: NONREACTIVE

## 2016-02-15 LAB — RPR: RPR Ser Ql: NONREACTIVE

## 2016-02-16 ENCOUNTER — Telehealth (HOSPITAL_COMMUNITY): Payer: Self-pay | Admitting: Emergency Medicine

## 2016-02-16 NOTE — Telephone Encounter (Signed)
[facility] from visit on 9/25  Results given prior by PCPs office  Gave pt hard copy of results.

## 2016-02-18 ENCOUNTER — Encounter (HOSPITAL_COMMUNITY): Payer: Self-pay | Admitting: Emergency Medicine

## 2016-02-29 NOTE — Telephone Encounter (Signed)
pcp removed

## 2016-06-23 ENCOUNTER — Encounter (HOSPITAL_COMMUNITY): Payer: Self-pay | Admitting: *Deleted

## 2016-06-30 ENCOUNTER — Ambulatory Visit (HOSPITAL_COMMUNITY)
Admission: RE | Admit: 2016-06-30 | Discharge: 2016-06-30 | Disposition: A | Discharge status: Home / Self Care | Payer: Medicaid Other | Source: Ambulatory Visit | Attending: Obstetrics and Gynecology | Admitting: Obstetrics and Gynecology

## 2016-06-30 ENCOUNTER — Encounter (HOSPITAL_COMMUNITY): Payer: Self-pay

## 2016-06-30 VITALS — BP 102/64 | Temp 98.4°F | Ht 64.0 in | Wt 124.0 lb

## 2016-06-30 DIAGNOSIS — R87619 Unspecified abnormal cytological findings in specimens from cervix uteri: Secondary | ICD-10-CM | POA: Diagnosis present

## 2016-06-30 DIAGNOSIS — Z1239 Encounter for other screening for malignant neoplasm of breast: Secondary | ICD-10-CM

## 2016-06-30 DIAGNOSIS — R87612 Low grade squamous intraepithelial lesion on cytologic smear of cervix (LGSIL): Secondary | ICD-10-CM

## 2016-06-30 NOTE — Progress Notes (Signed)
[facility] due to having an abnormal Pap smear and recommending a colposcopy for follow up. Last Pap smear completed 05/10/2016.   Pap Smear: Pap smear not completed today. [facility] and LGSIL with cells suspicious of HGSIL. [facility]ps[facility]. Appointment scheduled for Friday, July 07, 2016 at 0840. Per patient has no history of an abnormal Pap smear prior to the most recent Pap smear. Last Pap smear result is in EPIC.  Physical exam: Breasts Breasts symmetrical. No skin abnormalities bilateral breasts. No nipple retraction bilateral breasts. No nipple discharge bilateral breasts. No lymphadenopathy. No lumps palpated bilateral breasts. No complaints of pain or tenderness on exam. Screening mammogram recommended at age 24 unless clinically indicated prior.      Pelvic/Bimanual No Pap smear completed today since last Pap smear was 05/10/2016. Pap smear not indicated per BCCCP guidelines.   Smoking History: Patient has never smoked.  Patient Navigation: Patient education provided. [facility] services [facility].

## 2016-06-30 NOTE — Patient Instructions (Signed)
Explained breast self awareness with Dixie Dialsarolyn F Nham. Patient did not need a Pap smear today due to last Pap smear was 05/10/2016. Explained the colposcopy to patient the recommended follow up for her abnormal Pap smear. [facility]. Appointment scheduled for Friday, July 07, 2016 at 0840. Patient aware of appointment and will be there. Let patient know she will need a screening mammogram at age 26 unless clinically indicated prior.  Dixie Dialsarolyn F Winebarger verbalized understanding.  [place], Kathaleen Maserhristine Poll, RN 1:17 PM

## 2016-07-03 ENCOUNTER — Encounter (HOSPITAL_COMMUNITY): Payer: Self-pay | Admitting: *Deleted

## 2016-07-07 ENCOUNTER — Other Ambulatory Visit (HOSPITAL_COMMUNITY)
Admission: RE | Admit: 2016-07-07 | Discharge: 2016-07-07 | Disposition: A | Discharge status: Home / Self Care | Payer: Medicaid Other | Source: Ambulatory Visit | Attending: Obstetrics & Gynecology | Admitting: Obstetrics & Gynecology

## 2016-07-07 ENCOUNTER — Encounter: Payer: Self-pay | Admitting: Obstetrics & Gynecology

## 2016-07-07 ENCOUNTER — Ambulatory Visit (INDEPENDENT_AMBULATORY_CARE_PROVIDER_SITE_OTHER): Payer: Medicaid Other | Admitting: Obstetrics & Gynecology

## 2016-07-07 DIAGNOSIS — R87612 Low grade squamous intraepithelial lesion on cytologic smear of cervix (LGSIL): Secondary | ICD-10-CM | POA: Diagnosis present

## 2016-07-07 DIAGNOSIS — Z3202 Encounter for pregnancy test, result negative: Secondary | ICD-10-CM | POA: Diagnosis not present

## 2016-07-07 DIAGNOSIS — N871 Moderate cervical dysplasia: Secondary | ICD-10-CM | POA: Insufficient documentation

## 2016-07-07 LAB — POCT PREGNANCY, URINE: PREG TEST UR: NEGATIVE

## 2016-07-07 NOTE — Patient Instructions (Signed)
Colposcopy, Care After This sheet gives you information about how to care for yourself after your procedure. Your doctor may also give you more specific instructions. If you have problems or questions, contact your doctor. What can I expect after the procedure? If you did not have a tissue sample removed (did not have a biopsy), you may only have some spotting for a few days. You can go back to your normal activities. If you had a tissue sample removed, it is common to have:  Soreness and pain. This may last for a few days.  Light-headedness.  Mild bleeding from your vagina or dark-colored, grainy discharge from your vagina. This may last for a few days. You may need to wear a sanitary pad.  Spotting for at least 48 hours after the procedure. Follow these instructions at home:  Take over-the-counter and prescription medicines only as told by your doctor. Ask your doctor what medicines you can start taking again. This is very important if you take blood-thinning medicine.  Do not drive or use heavy machinery while taking prescription pain medicine.  For 3 days, or as long as your doctor tells you, avoid:  Douching.  Using tampons.  Having sex.  If you use birth control (contraception), keep using it.  Limit activity for the first day after the procedure. Ask your doctor what activities are safe for you.  It is up to you to get the results of your procedure. Ask your doctor when your results will be ready.  Keep all follow-up visits as told by your doctor. This is important. Contact a doctor if:  You get a skin rash. Get help right away if:  You are bleeding a lot from your vagina. It is a lot of bleeding if you are using more than one pad an hour for 2 hours in a row.  You have clumps of blood (blood clots) coming from your vagina.  You have a fever.  You have chills  You have pain in your lower belly (pelvic area).  You have signs of infection, such as vaginal  discharge that is:  Different than usual.  Yellow.  Bad-smelling.  You have very pain or cramps in your lower belly that do not get better with medicine.  You feel light-headed.  You feel dizzy.  You pass out (faint). Summary  If you did not have a tissue sample removed (did not have a biopsy), you may only have some spotting for a few days. You can go back to your normal activities.  If you had a tissue sample removed, it is common to have mild pain and spotting for 48 hours.  For 3 days, or as long as your doctor tells you, avoid douching, using tampons and having sex.  Get help right away if you have bleeding, very bad pain, or signs of infection. This information is not intended to replace advice given to you by your health care provider. Make sure you discuss any questions you have with your health care provider. Document Released: 10/25/2007 Document Revised: 01/26/2016 Document Reviewed: 01/26/2016 Elsevier Interactive Patient Education  2017 Elsevier Inc.  

## 2016-07-07 NOTE — Progress Notes (Signed)
Patient ID: Alyssa Long, female   DOB: 06/02/1990, 26 y.o.   MRN: [id number]  Chief Complaint  Patient presents with  . Colposcopy    HPI Alyssa DialsCarolyn F Kargbo is a 26 y.o. female.  [id number]  HPI  Indications: Pap smear on December 2017 showed: LSIL suspicious for HSIL. Previous colposcopy: no. Prior cervical treatment: no.  Past Medical History:  Diagnosis Date  . Headache(784.0)     No past surgical history on file.  Family History  Problem Relation Age of Onset  . Diabetes Maternal Grandmother     Social History Social History  Substance Use Topics  . Smoking status: Never Smoker  . Smokeless tobacco: Never Used  . Alcohol use 0.6 oz/week    1 Glasses of wine per week    Allergies  Allergen Reactions  . Shellfish Allergy     hives    Current Outpatient Prescriptions  Medication Sig Dispense Refill  . medroxyPROGESTERone (DEPO-PROVERA) 150 MG/ML injection Inject 150 mg into the muscle every 3 (three) months.    . norgestimate-ethinyl estradiol (ORTHO-CYCLEN,SPRINTEC,PREVIFEM) 0.25-35 MG-MCG tablet Take 1 tablet by mouth daily. (Patient not taking: Reported on 06/30/2016) 1 Package 11   No current facility-administered medications for this visit.     Review of Systems Review of Systems  Blood pressure 127/72, pulse 85, weight 119 lb 11.2 oz (54.3 kg), last menstrual period 06/16/2016.  Physical Exam Physical Exam  Data Reviewed Pap result  Assessment    Procedure Details  The risks and benefits of the procedure and Written informed consent obtained.  Speculum placed in vagina and excellent visualization of cervix achieved, cervix swabbed x 3 with acetic acid solution. TZ seen, no endocervical lesion, AWE from 9-1,  Specimens: ECC and Bx ay 1000  Complications: none. 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 Call  to discuss Pathology results in 2 weeks.      Scheryl DarterJames Tomara Youngberg 07/07/2016, 9:48 AM

## 2016-07-12 ENCOUNTER — Telehealth: Payer: Self-pay | Admitting: *Deleted

## 2016-07-12 NOTE — Telephone Encounter (Signed)
Per Dr. 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 I called pt and informed her of abnormal Colpo and need for LEEP.  Pt became very anxious, worried and had many questions. I further explained the condition and that she does not have Cancer. After a long discussion, pt voiced understanding of her condition and agreed to LEEP procedure. I advised that our office personnel will contact her with an appt.  She voiced understanding and stated that a message can be left on her voice mail.

## 2016-07-21 ENCOUNTER — Ambulatory Visit (INDEPENDENT_AMBULATORY_CARE_PROVIDER_SITE_OTHER): Payer: Medicaid Other | Admitting: Obstetrics and Gynecology

## 2016-07-21 ENCOUNTER — Encounter: Payer: Self-pay | Admitting: Obstetrics and Gynecology

## 2016-07-21 ENCOUNTER — Telehealth (HOSPITAL_COMMUNITY): Payer: Self-pay | Admitting: *Deleted

## 2016-07-21 ENCOUNTER — Ambulatory Visit (INDEPENDENT_AMBULATORY_CARE_PROVIDER_SITE_OTHER): Payer: Self-pay | Admitting: Clinical

## 2016-07-21 VITALS — BP 123/93 | HR 73 | Ht 64.0 in | Wt 123.0 lb

## 2016-07-21 DIAGNOSIS — F32A Depression, unspecified: Secondary | ICD-10-CM

## 2016-07-21 DIAGNOSIS — F329 Major depressive disorder, single episode, unspecified: Secondary | ICD-10-CM | POA: Diagnosis not present

## 2016-07-21 DIAGNOSIS — N938 Other specified abnormal uterine and vaginal bleeding: Secondary | ICD-10-CM | POA: Insufficient documentation

## 2016-07-21 DIAGNOSIS — F4323 Adjustment disorder with mixed anxiety and depressed mood: Secondary | ICD-10-CM

## 2016-07-21 NOTE — Progress Notes (Signed)
Patient came by office this morning stating she has been bleeding since her colpo on 2/15. Patient states bleeding ranges from light to heavy, Scotto to black to bright red. Patient also very emotional. States since being on the depo in September she has felt very emotional & had suicidal thoughts. Offered to patient to see Asher MuirJamie this morning following provider visit. Patient is agreeable and would like to see Asher MuirJamie.    As noted above. Colpo on 07/06/16 for abnormal pap. Reports some degree of bleeding since colpo. She is not sexual active at present Depo Provera for contraception. Last injection in December. Due end of March. Reports some irregular bleeding since starting Depo.  PE AF VSS Very emotional  GU Nl EGBUS, no active bleeding from cervix noted, scan blood form uterus, BX noted and healing Small friable area at 6 o'clock noted, AgNo3 applied.   A/P DUB        Depression/Anxiety  Suspect majority of pt's problems is related to Depo Provera and not the colpo. Reviewed with pt. Will see Asher MuirJamie today. Schedule LEEP and discuss other options for contraception at that time.

## 2016-07-21 NOTE — BH Specialist Note (Signed)
Session Start time: 11:00   End Time: 12:00 Total Time:  60 minutes Type of Service: Behavioral Health - Individual/Family Interpreter: No.   Interpreter Name & Language: n/a # [facility]-June 2018[facility]  SUBJECTIVE: Alyssa Long is a 26 y.o. female  Pt. was referred by Dr Alysia PennaErvin for:  anxiety and depression. Pt. reports the following symptoms/concerns: Pt states that she has felt depressed and anxious since obtaining birth control shot, with excessive crying; pt open to coping strategies until she is feeling better. Duration of problem:  Two months Severity: severe Previous treatment: none  OBJECTIVE: Mood: Anxious and Depressed & Affect: Tearful Risk of harm to self or others: Low risk of harm to self or others today, no SI in past, fleeting thoughts, but no plan.  Assessments administered: PHQ9:17/ GAD7: 19  LIFE CONTEXT:  Family & Social: Lives with parents (Who,family proximity, relationship, friends) Product/process development scientistchool/ Work: Unemployed (Where, how often, or financial support) Self-Care: Sleep issues (Exercise, sleep, eat, substances) Life changes: Relationship breakup, bieth control What is important to pt/family (values): Overall wellbeing  GOALS ADDRESSED:  -Reduce symptoms of anxiety and depression -Safety planning  INTERVENTIONS: Motivational Interviewing, Meditation: relaxation breathing and Other: Safety planning   ASSESSMENT:  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  Pt may benefit from psychoeducation and brief therapeutic interventions regarding coping with anxiety and depression  PLAN: 1. F/U with behavioral health clinician: Two weeks 2. Behavioral Health meds: none 3. Behavioral recommendations:  -Follow Safety Plan -Practice CALM relaxation breathing exercise exercise daily -Read educational material regarding coping coping with symptoms of anxiety and depression 4. Referral: Brief Counseling/Psychotherapy,  Problem-solving teaching/coping strategies and Psychoeducation 5. From scale of 1-10, how likely are you to follow plan: 10  [facility] Clin[facility]  Behavioral Health Clinician  Marlon PelWarmhandoff:   Warm Hand Off Completed.        Depression screen PHQ 2/9 07/21/2016  Decreased Interest 0  Down, Depressed, Hopeless 3  PHQ - 2 Score 3  Altered sleeping 1  Tired, decreased energy 2  Change in appetite 1  Feeling bad or failure about yourself  3  Trouble concentrating 3  Moving slowly or fidgety/restless 1  Suicidal thoughts 3  PHQ-9 Score 17   GAD 7 : Generalized Anxiety Score 07/21/2016  Nervous, Anxious, on Edge 2  Control/stop worrying 3  Worry too much - different things 3  Trouble relaxing 3  Restless 2  Easily annoyed or irritable 3  Afraid - awful might happen 3  Total GAD 7 Score 19

## 2016-07-21 NOTE — Patient Instructions (Addendum)
Dysfunctional Uterine Bleeding Dysfunctional uterine bleeding is abnormal bleeding from the uterus. Dysfunctional uterine bleeding includes:  A period that comes earlier or later than usual.  A period that is lighter, heavier, or has blood clots.  Bleeding between periods.  Skipping one or more periods.  Bleeding after sexual intercourse.  Bleeding after menopause. Follow these instructions at home: Pay attention to any changes in your symptoms. Follow these instructions to help with your condition: Eating and drinking   Eat well-balanced meals. Include foods that are high in iron, such as liver, meat, shellfish, green leafy vegetables, and eggs.  If you become constipated:  Drink plenty of water.  Eat fruits and vegetables that are high in water and fiber, such as spinach, carrots, raspberries, apples, and mango. Medicines   Take over-the-counter and prescription medicines only as told by your health care provider.  Do not change medicines without talking with your health care provider.  Aspirin or medicines that contain aspirin may make the bleeding worse. Do not take those medicines:  During the week before your period.  During your period.  If you were prescribed iron pills, take them as told by your health care provider. Iron pills help to replace iron that your body loses because of this condition. Activity   If you need to change your sanitary pad or tampon more than one time every 2 hours:  Lie in bed with your feet raised (elevated).  Place a cold pack on your lower abdomen.  Rest as much as possible until the bleeding stops or slows down.  Do not try to lose weight until the bleeding has stopped and your blood iron level is back to normal. Other Instructions   For two months, write down:  When your period starts.  When your period ends.  When any abnormal bleeding occurs.  What problems you notice.  Keep all follow up visits as told by your  health care provider. This is important. Contact a health care provider if:  You get light-headed or weak.  You have nausea and vomiting.  You cannot eat or drink without vomiting.  You feel dizzy or have diarrhea while you are taking medicines.  You are taking birth control pills or hormones, and you want to change them or stop taking them. Get help right away if:  You develop a fever or chills.  You need to change your sanitary pad or tampon more than one time per hour.  Your bleeding becomes heavier, or your flow contains clots more often.  You develop pain in your abdomen.  You lose consciousness.  You develop a rash. This information is not intended to replace advice given to you by your health care provider. Make sure you discuss any questions you have with your health care provider. Document Released: 05/05/2000 Document Revised: 10/14/2015 Document Reviewed: 08/03/2014 Elsevier Interactive Patient Education  2017 Elsevier Inc.  

## 2016-07-21 NOTE — Patient Instructions (Signed)
My Safety Plan:   Step 1: Warning signs (thoughts, images, mood, situation, behavior) that a crisis may be developing:   Step 2: Internal coping strategies: Things I can do to take my mind off my problems without contacting another person (music, relaxation technique, physical activity): organizing environment, soothing music, CALM relaxation breathing exercise)  Step 3: People and social settings that provide distraction, and to ask for help: [place] Name and contact: Parents(Alyssa and Alyssa GoodellSheila Long) [phone]/ [phone]  Step 4: Professionals or agencies I can contact during a crisis Local urgent care services:      1. 9-1-1     2. [facility] (24/7 walk-in) [address], [place]     3. [facility]: Intake- [phone]/ [phone]     4. [facility] (862) [phone]  Suicide Prevention Lifeline Phone: [phone]  Step 5: Making the environment safe- Have friend/family member remove from home:  * Weapons in the home: none in home * Medication in the home (including Tylenol): none in home  Step 7: The one thing that is most important to me and worth living for is: My parents  Signature of Patient: _________________________________________________  Signature of Provider: ________________________________________________

## 2016-07-24 NOTE — Telephone Encounter (Signed)
Late Entry: [facility] on Friday, July 21, 2016 due to bleeding for two weeks post colposcopy. Called patient back and asked her to describe the bleeding. Patient stated is was a lot. Asked patient if she is bleeding through a pad in less than hour and patient denied. Patient stated it is more than spotting and was told the bleeding would stop 3-5 days post procedure. Told patient she needs to call the [facility] where the [facility] was completed. Advised [facility] to [facility] if she bleeding through a pad in less than a hour. Gave patient phone number to the [facility]. Patient verbalized understanding.

## 2016-08-03 ENCOUNTER — Encounter: Payer: Self-pay | Admitting: Obstetrics & Gynecology

## 2016-08-03 ENCOUNTER — Other Ambulatory Visit (HOSPITAL_COMMUNITY)
Admission: RE | Admit: 2016-08-03 | Discharge: 2016-08-03 | Disposition: A | Discharge status: Home / Self Care | Payer: Medicaid Other | Source: Ambulatory Visit | Attending: Obstetrics & Gynecology | Admitting: Obstetrics & Gynecology

## 2016-08-03 ENCOUNTER — Ambulatory Visit (INDEPENDENT_AMBULATORY_CARE_PROVIDER_SITE_OTHER): Payer: Self-pay | Admitting: Obstetrics & Gynecology

## 2016-08-03 DIAGNOSIS — N871 Moderate cervical dysplasia: Secondary | ICD-10-CM | POA: Insufficient documentation

## 2016-08-03 DIAGNOSIS — Z3202 Encounter for pregnancy test, result negative: Secondary | ICD-10-CM

## 2016-08-03 LAB — POCT PREGNANCY, URINE: Preg Test, Ur: NEGATIVE

## 2016-08-03 NOTE — Patient Instructions (Signed)
Cervical Conization, Care After °This sheet gives you information about how to care for yourself after your procedure. Your doctor may also give you more specific instructions. If you have problems or questions, contact your doctor. °Follow these instructions at home: °Medicines °· Take over-the-counter and prescription medicines only as told by your doctor. °· Do not take aspirin until your doctor says it is okay. °· If you take pain medicine: °? You may have constipation. To help treat this, your doctor may tell you to: °§ Drink enough fluid to keep your pee (urine) clear or pale yellow. °§ Take medicines. °§ Eat foods that are high in fiber. These include fresh fruits and vegetables, whole grains, bran, and beans. °§ Limit foods that are high in fat and sugar. These include fried foods and sweet foods. °? Do not drive or use heavy machines. °General instructions °· You can eat your usual diet unless your doctor tells you not to do so. °· Take showers for the first week. Do not take baths, swim, or use hot tubs until your doctor says it is okay. °· Do not douche, use tampons, or have sex until your doctor says it is okay. °· For 7-14 days after your procedure, avoid: °? Being very active. °? Exercising. °? Heavy lifting. °· Keep all follow-up visits as told by your doctor. This is important. °Contact a doctor if: °· You have a rash. °· You are dizzy or lightheaded. °· You feel sick to your stomach (nauseous). °· You throw up (vomit). °· You have fluid from your vagina (vaginal discharge) that smells bad. °Get help right away if: °· There are blood clots coming from your vagina. °· You have more bleeding than you would have in a normal period. For example, you soak a pad in less than 1 hour. °· You have a fever. °· You have more and more cramps. °· You pass out (faint). °· You have pain when peeing. °· Your have a lot of pain. °· Your pain gets worse. °· Your pain does not get better when you take your  medicine. °· You have blood in your pee. °· You throw up (vomit). °Summary °· After your procedure, take over-the-counter and prescription medicines only as told by your doctor. °· Do not douche, use tampons, or have sex until your doctor says it is okay. °· For about 7-14 days after your procedure, try not to exercise or lift heavy objects. °· Get help right away if you have new symptoms, or if your symptoms become worse. °This information is not intended to replace advice given to you by your health care provider. Make sure you discuss any questions you have with your health care provider. °Document Released: 02/15/2008 Document Revised: 05/10/2016 Document Reviewed: 05/10/2016 °Elsevier Interactive Patient Education © 2017 Elsevier Inc. ° °

## 2016-08-03 NOTE — Progress Notes (Signed)
Cc: LEEP scheduled  26 y.o. Patient's last menstrual period was 06/16/2016 (lmp unknown). 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  Patient identified, informed consent obtained, signed copy in chart, time out performed.  Pap smear and colposcopy reviewed.   Pap LSIL suspicious for high grade Colpo Biopsy CIN 2 ECC negative Teflon coated speculum with smoke evacuator placed.  Cervix visualized. Paracervical block placed.  Small Fischer size loop used to remove cone of cervix using blend of cut and cautery on LEEP machine.  Edges/Base cauterized with ball.  Monsel's solution used for hemostasis.  Patient tolerated procedure well.  Patient given post procedure instructions.  Follow up in 12 months for repeat pap or as needed.  Adam PhenixJames G Briea Mcenery, MD 08/03/2016

## 2016-08-03 NOTE — Progress Notes (Signed)
States depoprovera made her have depression and feel bad. States she does not plan to get any further depo-provera injections.

## 2016-08-08 ENCOUNTER — Telehealth: Payer: Self-pay

## 2016-08-08 NOTE — Telephone Encounter (Signed)
CIN 2 to 3 and may repeat pap 12 months  Called patient no answer or voicemail to leave a message.I will attempt to call patient later.

## 2016-08-09 NOTE — Telephone Encounter (Signed)
Called patient & informed her of results & recommendations. Patient verbalized understanding & asked about getting her birth control switched but she knows she cannot get anything until a month past her procedure. Told patient I will send the front office staff a message to call her with an appt. Patient verbalized understanding to all & had no questions

## 2016-09-05 ENCOUNTER — Ambulatory Visit (INDEPENDENT_AMBULATORY_CARE_PROVIDER_SITE_OTHER): Payer: Self-pay | Admitting: Obstetrics & Gynecology

## 2016-09-05 ENCOUNTER — Encounter: Payer: Self-pay | Admitting: Obstetrics & Gynecology

## 2016-09-05 VITALS — BP 125/65 | HR 82 | Ht 64.0 in | Wt 122.4 lb

## 2016-09-05 DIAGNOSIS — N938 Other specified abnormal uterine and vaginal bleeding: Secondary | ICD-10-CM

## 2016-09-05 MED ORDER — NORGESTIM-ETH ESTRAD TRIPHASIC 0.18/0.215/0.25 MG-25 MCG PO TABS: 1.0000 | ORAL_TABLET | Freq: Every day | ORAL | 11 refills | Status: DC

## 2016-09-05 NOTE — Progress Notes (Signed)
Subjective:     Patient ID: Alyssa Long, female   DOB: 02-25-1991, 26 y.o.   MRN: [id number] Cc: vaginal bleeding HPI [id number] Patient's last menstrual period was 05/04/2016 (approximate). 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   Genitourinary: Positive for vaginal bleeding and vaginal discharge. Negative for pelvic pain.       Objective:   Physical Exam  Constitutional: She is oriented to person, place, and time. She appears well-developed. No distress.  Cardiovascular: Normal rate.   Genitourinary: Vaginal discharge (small amount of vaginal bleeding) found.  Neurological: She is alert and oriented to person, place, and time.  Psychiatric: She has a normal mood and affect. Her behavior is normal.  Vitals reviewed.      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

## 2016-09-05 NOTE — Patient Instructions (Signed)
Oral Contraception Use Oral contraceptive pills (OCPs) are medicines taken to prevent pregnancy. OCPs work by preventing the ovaries from releasing eggs. The hormones in OCPs also cause the cervical mucus to thicken, preventing the sperm from entering the uterus. The hormones also cause the uterine lining to become thin, not allowing a fertilized egg to attach to the inside of the uterus. OCPs are highly effective when taken exactly as prescribed. However, OCPs do not prevent sexually transmitted diseases (STDs). Safe sex practices, such as using condoms along with an OCP, can help prevent STDs. Before taking OCPs, you may have a physical exam and Pap test. Your health care provider may also order blood tests if necessary. Your health care provider will make sure you are a good candidate for oral contraception. Discuss with your health care provider the possible side effects of the OCP you may be prescribed. When starting an OCP, it can take 2 to 3 months for the body to adjust to the changes in hormone levels in your body. How to take oral contraceptive pills Your health care provider may advise you on how to start taking the first cycle of OCPs. Otherwise, you can:  Start on day 1 of your menstrual period. You will not need any backup contraceptive protection with this start time.  Start on the first Sunday after your menstrual period or the day you get your prescription. In these cases, you will need to use backup contraceptive protection for the first week.  Start the pill at any time of your cycle. If you take the pill within 5 days of the start of your period, you are protected against pregnancy right away. In this case, you will not need a backup form of birth control. If you start at any other time of your menstrual cycle, you will need to use another form of birth control for 7 days. If your OCP is the type called a minipill, it will protect you from pregnancy after taking it for 2 days (48  hours).  After you have started taking OCPs:  If you forget to take 1 pill, take it as soon as you remember. Take the next pill at the regular time.  If you miss 2 or more pills, call your health care provider because different pills have different instructions for missed doses. Use backup birth control until your next menstrual period starts.  If you use a 28-day pack that contains inactive pills and you miss 1 of the last 7 pills (pills with no hormones), it will not matter. Throw away the rest of the non-hormone pills and start a new pill pack.  No matter which day you start the OCP, you will always start a new pack on that same day of the week. Have an extra pack of OCPs and a backup contraceptive method available in case you miss some pills or lose your OCP pack. Follow these instructions at home:  Do not smoke.  Always use a condom to protect against STDs. OCPs do not protect against STDs.  Use a calendar to mark your menstrual period days.  Read the information and directions that came with your OCP. Talk to your health care provider if you have questions. Contact a health care provider if:  You develop nausea and vomiting.  You have abnormal vaginal discharge or bleeding.  You develop a rash.  You miss your menstrual period.  You are losing your hair.  You need treatment for mood swings or depression.  You   get dizzy when taking the OCP.  You develop acne from taking the OCP.  You become pregnant. Get help right away if:  You develop chest pain.  You develop shortness of breath.  You have an uncontrolled or severe headache.  You develop numbness or slurred speech.  You develop visual problems.  You develop pain, redness, and swelling in the legs. This information is not intended to replace advice given to you by your health care provider. Make sure you discuss any questions you have with your health care provider. Document Released: 04/27/2011 Document  Revised: 10/14/2015 Document Reviewed: 10/27/2012 Elsevier Interactive Patient Education  2017 Elsevier Inc.  

## 2016-09-15 ENCOUNTER — Encounter (HOSPITAL_COMMUNITY): Payer: Self-pay | Admitting: *Deleted

## 2016-10-03 ENCOUNTER — Emergency Department (HOSPITAL_COMMUNITY)
Admission: EM | Admit: 2016-10-03 | Discharge: 2016-10-03 | Disposition: A | Discharge status: Home / Self Care | Payer: Medicaid Other | Attending: Emergency Medicine | Admitting: Emergency Medicine

## 2016-10-03 ENCOUNTER — Encounter (HOSPITAL_COMMUNITY): Payer: Self-pay | Admitting: Nurse Practitioner

## 2016-10-03 DIAGNOSIS — Y999 Unspecified external cause status: Secondary | ICD-10-CM | POA: Diagnosis not present

## 2016-10-03 DIAGNOSIS — Z113 Encounter for screening for infections with a predominantly sexual mode of transmission: Secondary | ICD-10-CM

## 2016-10-03 DIAGNOSIS — Y929 Unspecified place or not applicable: Secondary | ICD-10-CM | POA: Diagnosis not present

## 2016-10-03 DIAGNOSIS — X58XXXA Exposure to other specified factors, initial encounter: Secondary | ICD-10-CM | POA: Diagnosis not present

## 2016-10-03 DIAGNOSIS — Z202 Contact with and (suspected) exposure to infections with a predominantly sexual mode of transmission: Secondary | ICD-10-CM | POA: Diagnosis not present

## 2016-10-03 DIAGNOSIS — T192XXA Foreign body in vulva and vagina, initial encounter: Secondary | ICD-10-CM | POA: Diagnosis present

## 2016-10-03 DIAGNOSIS — Y939 Activity, unspecified: Secondary | ICD-10-CM | POA: Insufficient documentation

## 2016-10-03 LAB — WET PREP, GENITAL
SPERM: NONE SEEN
Trich, Wet Prep: NONE SEEN
WBC WET PREP: NONE SEEN

## 2016-10-03 NOTE — ED Triage Notes (Signed)
Pt presents with c/o foreign body in vagina and STD testing. She placed a tampon in this morning and now can not find the tampon string. She is unsure if the tampon is still there. She would also like STD testing while here. She denies any vaginal discharge, p[ain or signs of infection. She does report abnormal vaginal bleeding since her last depo shot in December.

## 2016-10-03 NOTE — Discharge Instructions (Signed)
We have tested you today for gonorrhea, Chlamydia, trichomonas, bacterial vaginosis, HIV and syphilis. [facility]

## 2016-10-03 NOTE — ED Provider Notes (Signed)
[facility] Provider Note   CSN: [id number] Arrival date & time: 10/03/16  1329  By signing my name below, I, Linna Darner, attest that this documentation has been prepared under the direction and in the presence of non-physician practitioner, [facility] M. Damian Leavell, NP. Electronically Signed: Linna Darner, Scribe. 10/03/2016. 4:16 PM.  History   Chief Complaint Chief Complaint  Patient presents with  . SEXUALLY TRANSMITTED DISEASE  . Foreign Body   The history is provided by the patient. No language interpreter was used.  Foreign Body  The current episode started 6 to 12 hours ago. The foreign body is suspected to be in the vagina. Suspected object: tampon. The incident was reported. The incident was witnessed/reported by the patient. Pertinent negatives include no fever, no abdominal pain, no vaginal discharge, no vaginal pain and no rectal pain. Associated medical issues do not include prior foreign body removal, esophageal disease or mental retardation.    HPI Comments: Alyssa Long is a 26 y.o. female who presents to the Emergency Department for evaluation of a foreign body in her vagina. She states she put a tampon in this morning but cannot locate the string. She has used the bathroom several times today and has not seen the tampon in the toilet bowl. Patient denies vaginal pain, vaginal discharge, abdominal pain, fevers, chills, diarrhea, or any other associated symptoms.  Patient is additionally requesting STD testing while she is here. She is asymptomatic but wants testing because "it's been six months since my last check". She notes she has not had vaginal intercourse in 6 months but does endorse regular oral sex.  Past Medical History:  Diagnosis Date  . Headache(784.0)   . Vaginal Pap smear, abnormal     Patient Active Problem List   Diagnosis Date Noted  . Dysplasia of cervix, high grade CIN 2 08/03/2016  . DUB (dysfunctional uterine bleeding) 07/21/2016  . LGSIL  on Pap smear of cervix 07/07/2016    Past Surgical History:  Procedure Laterality Date  . COLPOSCOPY      OB History    Gravida Para Term Preterm AB Living   2 0 0 0 2     SAB TAB Ectopic Multiple Live Births   0 2 0           Home Medications    Prior to Admission medications   Medication Sig Start Date End Date Taking? Authorizing Provider  Norgestimate-Ethinyl Estradiol Triphasic 0.18/0.215/0.25 MG-25 MCG tab Take 1 tablet by mouth daily. 09/05/16   Adam Phenix, MD    Family History Family History  Problem Relation Age of Onset  . Diabetes Maternal Grandmother     Social History Social History  Substance Use Topics  . Smoking status: Never Smoker  . Smokeless tobacco: Never Used  . Alcohol use 0.6 oz/week    1 Glasses of wine per week     Allergies   Depo-provera [medroxyprogesterone acetate] and Shellfish allergy   Review of Systems Review of Systems  Constitutional: Negative for fever.  Gastrointestinal: Negative for abdominal pain, diarrhea and rectal pain.  Genitourinary: Negative for vaginal discharge and vaginal pain.       Positive for foreign body.  All other systems reviewed and are negative.  Physical Exam Updated Vital Signs BP (!) 116/56   Pulse 79   Temp 99.1 F (37.3 C) (Oral)   Resp 16   SpO2 99%   Physical Exam  Constitutional: She is oriented to person, place, and time. She  appears well-developed and well-nourished. No distress.  HENT:  Head: Normocephalic and atraumatic.  Eyes: Conjunctivae and EOM are normal.  Neck: Neck supple. No tracheal deviation present.  Cardiovascular: Normal rate.   Pulmonary/Chest: Effort normal. No respiratory distress.  Abdominal: Soft. She exhibits no distension. There is no tenderness.  Genitourinary:  Genitourinary Comments: External genitalia without lesions, foreign body noted in vaginal vault. After removed there is no erythema, no CMT, no adnexal tenderness, uterus is not enlarged.  Samples collected for STD check.  Musculoskeletal: Normal range of motion.  Neurological: She is alert and oriented to person, place, and time.  Skin: Skin is warm and dry.  Psychiatric: She has a normal mood and affect. Her behavior is normal.  Nursing note and vitals reviewed.  ED Treatments / Results  Labs (all labs ordered are listed, but only abnormal results are displayed) Labs Reviewed  WET PREP, GENITAL - Abnormal; Notable for the following:       Result Value   Yeast Wet Prep HPF POC PRESENT (*)    Clue Cells Wet Prep HPF POC PRESENT (*)    All other components within normal limits  RPR  HIV ANTIBODY (ROUTINE TESTING)  GC/CHLAMYDIA PROBE AMP ([place]) NOT AT [facility]    Radiology No results found.  Procedures .Foreign Body Removal Date/Time: 10/03/2016 4:23 PM Performed by: Janne NapoleonNEESE, HOPE M Authorized by: Janne NapoleonNEESE, HOPE M  Consent: Verbal consent obtained. Consent given by: patient Patient identity confirmed: verbally with patient Body area: vagina Patient cooperative: yes Localization method: visualized Removal mechanism: forceps Complexity: simple 1 objects recovered. Objects recovered: tampon Post-procedure assessment: foreign body removed Patient tolerance: Patient tolerated the procedure well with no immediate complications   (including critical care time)  DIAGNOSTIC STUDIES: Oxygen Saturation is 99% on RA, normal by my interpretation.    COORDINATION OF CARE: 4:14 PM Discussed treatment plan with pt at bedside and pt agreed to plan.  Medications Ordered in ED Medications - No data to display   Initial Impression / Assessment and Plan / ED Course  I have reviewed the triage vital signs and the nursing notes.  Final Clinical Impressions(s) / ED Diagnoses   Final diagnoses:  Foreign body in vagina, initial encounter  Screen for STD (sexually transmitted disease)    New Prescriptions New Prescriptions   No medications on file   I personally  performed the services described in this documentation, which was scribed in my presence. The recorded information has been reviewed and is accurate.    Kerrie Buffaloeese, Hope PeeverM, [place]NP 10/03/16 1652    Raeford RazorKohut, Stephen, MD 10/03/16 [phone]

## 2016-10-04 LAB — GC/CHLAMYDIA PROBE AMP (~~LOC~~) NOT AT ARMC
Chlamydia: NEGATIVE
Neisseria Gonorrhea: NEGATIVE

## 2016-10-04 LAB — RPR: RPR: NONREACTIVE

## 2016-10-04 LAB — HIV ANTIBODY (ROUTINE TESTING W REFLEX): HIV Screen 4th Generation wRfx: NONREACTIVE

## 2017-04-30 ENCOUNTER — Ambulatory Visit (HOSPITAL_COMMUNITY)
Admission: EM | Admit: 2017-04-30 | Discharge: 2017-04-30 | Disposition: A | Discharge status: Home / Self Care | Payer: Self-pay | Attending: Family Medicine | Admitting: Family Medicine

## 2017-04-30 ENCOUNTER — Encounter (HOSPITAL_COMMUNITY): Payer: Self-pay | Admitting: Emergency Medicine

## 2017-04-30 DIAGNOSIS — J029 Acute pharyngitis, unspecified: Secondary | ICD-10-CM | POA: Insufficient documentation

## 2017-04-30 DIAGNOSIS — Z91013 Allergy to seafood: Secondary | ICD-10-CM | POA: Insufficient documentation

## 2017-04-30 DIAGNOSIS — Z888 Allergy status to other drugs, medicaments and biological substances status: Secondary | ICD-10-CM | POA: Insufficient documentation

## 2017-04-30 DIAGNOSIS — Z87410 Personal history of cervical dysplasia: Secondary | ICD-10-CM | POA: Insufficient documentation

## 2017-04-30 LAB — POCT RAPID STREP A: Streptococcus, Group A Screen (Direct): NEGATIVE

## 2017-04-30 MED ORDER — FLUTICASONE PROPIONATE 50 MCG/ACT NA SUSP: 2.0000 | Freq: Every day | NASAL | 0 refills | Status: DC

## 2017-04-30 MED ORDER — PENICILLIN V POTASSIUM 500 MG PO TABS: 500.0000 mg | ORAL_TABLET | Freq: Two times a day (BID) | ORAL | 0 refills | Status: AC

## 2017-04-30 MED ORDER — CETIRIZINE-PSEUDOEPHEDRINE ER 5-120 MG PO TB12: 1.0000 | ORAL_TABLET | Freq: Every day | ORAL | 0 refills | Status: DC

## 2017-04-30 NOTE — ED Provider Notes (Signed)
[facility]    CSN: [id number] Arrival date & time: 04/30/17  1204     History   Chief Complaint Chief Complaint  Patient presents with  . Sore Throat    HPI Alyssa Long is a 26 y.o. female.   26 year old female comes in for 5 day history of URI symptoms. States mainly with sore throat, but has had some cough, congestion, rhinorrhea. Denies ear pain, fevers, chills, night sweats. Dysphagia without problems swallowing. States trouble breathing at night due to nasal congestion. Tried tea, salt water gurgles without relief. Negative sick contact. Never smoker.       Past Medical History:  Diagnosis Date  . Headache(784.0)   . Vaginal Pap smear, abnormal     Patient Active Problem List   Diagnosis Date Noted  . Dysplasia of cervix, high grade CIN 2 08/03/2016  . DUB (dysfunctional uterine bleeding) 07/21/2016  . LGSIL on Pap smear of cervix 07/07/2016    Past Surgical History:  Procedure Laterality Date  . COLPOSCOPY      OB History    Gravida Para Term Preterm AB Living   2 0 0 0 2     SAB TAB Ectopic Multiple Live Births   0 2 0           Home Medications    Prior to Admission medications   Medication Sig Start Date End Date Taking? Authorizing Provider  cetirizine-pseudoephedrine (ZYRTEC-D) 5-120 MG tablet Take 1 tablet by mouth daily. 04/30/17   Cathie HoopsYu, Akesha Uresti V, PA-C  fluticasone (FLONASE) 50 MCG/ACT nasal spray Place 2 sprays into both nostrils daily. 04/30/17   Cathie HoopsYu, Pragya Lofaso V, PA-C  penicillin v potassium (VEETID) 500 MG tablet Take 1 tablet (500 mg total) by mouth 2 (two) times daily for 10 days. 04/30/17 05/10/17  Belinda FisherYu, Coriann Brouhard V, PA-C    Family History Family History  Problem Relation Age of Onset  . Diabetes Maternal Grandmother     Social History Social History   Tobacco Use  . Smoking status: Never Smoker  . Smokeless tobacco: Never Used  Substance Use Topics  . Alcohol use: Yes    Alcohol/week: 0.6 oz    Types: 1 Glasses of wine per  week  . Drug use: No     Allergies   Depo-provera [medroxyprogesterone acetate] and Shellfish allergy   Review of Systems Review of Systems  Reason unable to perform ROS: See HPI as above.     Physical Exam Triage Vital Signs ED Triage Vitals  Enc Vitals Group     BP 04/30/17 1229 (!) 110/47     Pulse Rate 04/30/17 1229 83     Resp 04/30/17 1229 16     Temp 04/30/17 1229 98.7 F (37.1 C)     Temp Source 04/30/17 1229 Oral     SpO2 04/30/17 1229 100 %     Weight 04/30/17 1230 132 lb (59.9 kg)     Height --      Head Circumference --      Peak Flow --      Pain Score 04/30/17 1230 9     Pain Loc --      Pain Edu? --      Excl. in GC? --    No data found.  Updated Vital Signs BP (!) 110/47   Pulse 83   Temp 98.7 F (37.1 C) (Oral)   Resp 16   Wt 132 lb (59.9 kg)   LMP 04/17/2017  SpO2 100%   BMI 22.66 kg/m   Physical Exam  Constitutional: She is oriented to person, place, and time. She appears well-developed and well-nourished. No distress.  HENT:  Head: Normocephalic and atraumatic.  Right Ear: External ear and ear canal normal.  Left Ear: External ear and ear canal normal.  Nose: Nose normal. Right sinus exhibits no maxillary sinus tenderness and no frontal sinus tenderness. Left sinus exhibits no maxillary sinus tenderness and no frontal sinus tenderness.  Mouth/Throat: Uvula is midline and mucous membranes are normal. Posterior oropharyngeal erythema present. Tonsils are 3+ on the right. Tonsils are 3+ on the left. Tonsillar exudate (bilateral).  Cerumen impaction of bilateral ears. TM not visible.   Eyes: Conjunctivae are normal. Pupils are equal, round, and reactive to light.  Neck: Normal range of motion. Neck supple.  Cardiovascular: Normal rate, regular rhythm and normal heart sounds. Exam reveals no gallop and no friction rub.  No murmur heard. Pulmonary/Chest: Effort normal and breath sounds normal. She has no decreased breath sounds. She has no  wheezes. She has no rhonchi. She has no rales.  Lymphadenopathy:    She has cervical adenopathy.  Neurological: She is alert and oriented to person, place, and time.  Skin: Skin is warm and dry.  Psychiatric: She has a normal mood and affect. Her behavior is normal. Judgment normal.     UC Treatments / Results  Labs (all labs ordered are listed, but only abnormal results are displayed) Labs Reviewed  CULTURE, GROUP A STREP [facility]  POCT RAPID STREP A    EKG  EKG Interpretation None       Radiology No results found.  Procedures Procedures (including critical care time)  Medications Ordered in UC Medications - No data to display   Initial Impression / Assessment and Plan / UC Course  I have reviewed the triage vital signs and the nursing notes.  Pertinent labs & imaging results that were available during my care of the patient were reviewed by me and considered in my medical decision making (see chart for details).    Rapid strep negative. However, due to significant swollen tonsils with exudate, will treat empirically for tonsillitis with penicillin. Symptomatic treatment as needed. Return precautions given.    Final Clinical Impressions(s) / UC Diagnoses   Final diagnoses:  Acute pharyngitis, unspecified etiology    ED Discharge Orders        Ordered    penicillin v potassium (VEETID) 500 MG tablet  2 times daily     04/30/17 1328    fluticasone (FLONASE) 50 MCG/ACT nasal spray  Daily     04/30/17 1328    cetirizine-pseudoephedrine (ZYRTEC-D) 5-120 MG tablet  Daily     04/30/17 1328        Belinda FisherYu, Harden Bramer V, [place]PA-C 04/30/17 1332

## 2017-04-30 NOTE — Discharge Instructions (Signed)
Rapid strep negative. However, due to your exam, will start you on penicillin empirically. Flonase and/or Zyrtec-D for nasal congestion. You can use over the counter nasal saline rinse such as neti pot for nasal congestion. Monitor for any worsening of symptoms, swelling of the throat, trouble breathing, trouble swallowing, follow up for reevaluation.   For sore throat try using a honey-based tea. Use 3 teaspoons of honey with juice squeezed from half lemon. Place shaved pieces of ginger into 1/2-1 cup of water and warm over stove top. Then mix the ingredients and repeat every 4 hours as needed.

## 2017-04-30 NOTE — ED Triage Notes (Signed)
PT reports sore throat that started Dec. 5. PT reports nasal congestion as well. No fevers.

## 2017-05-01 LAB — CYTOLOGY, (ORAL, ANAL, URETHRAL) ANCILLARY ONLY
CHLAMYDIA, DNA PROBE: NEGATIVE
Candida vaginitis: NEGATIVE
NEISSERIA GONORRHEA: NEGATIVE
Trichomonas: NEGATIVE

## 2017-05-02 LAB — CULTURE, GROUP A STREP (THRC)

## 2020-02-18 ENCOUNTER — Encounter (HOSPITAL_COMMUNITY): Payer: Self-pay | Admitting: Emergency Medicine

## 2020-02-18 ENCOUNTER — Other Ambulatory Visit: Payer: Self-pay

## 2020-02-18 ENCOUNTER — Ambulatory Visit (HOSPITAL_COMMUNITY)
Admission: EM | Admit: 2020-02-18 | Discharge: 2020-02-18 | Disposition: A | Discharge status: Home / Self Care | Payer: No Typology Code available for payment source | Attending: Urgent Care | Admitting: Urgent Care

## 2020-02-18 DIAGNOSIS — H01132 Eczematous dermatitis of right lower eyelid: Secondary | ICD-10-CM | POA: Diagnosis not present

## 2020-02-18 DIAGNOSIS — R21 Rash and other nonspecific skin eruption: Secondary | ICD-10-CM

## 2020-02-18 DIAGNOSIS — H01131 Eczematous dermatitis of right upper eyelid: Secondary | ICD-10-CM

## 2020-02-18 MED ORDER — FLUCONAZOLE 150 MG PO TABS: 150.0000 mg | ORAL_TABLET | ORAL | 0 refills | Status: DC

## 2020-02-18 MED ORDER — FLUORESCEIN SODIUM 1 MG OP STRP
ORAL_STRIP | OPHTHALMIC | Status: AC
  Filled 2020-02-18: qty 1

## 2020-02-18 MED ORDER — TETRACAINE HCL 0.5 % OP SOLN
OPHTHALMIC | Status: AC
  Filled 2020-02-18: qty 4

## 2020-02-18 MED ORDER — HYDROCORTISONE 1 % EX CREA: TOPICAL_CREAM | CUTANEOUS | 0 refills | Status: DC

## 2020-02-18 NOTE — Discharge Instructions (Signed)
Use light layer of hydrocortisone cream 2 times daily for 1 week for your eyelid of right eye. Use diflucan for the rash on your body, leg. Take 1 tablet today, then another in 1 week.

## 2020-02-18 NOTE — ED Triage Notes (Addendum)
Pt c/o possible cut in her right eye about 3 weeks ago. She states she thought that it healed but then she feels the same irritation. Pt also c/o rash/abrasion on right thigh and on her back.

## 2020-02-18 NOTE — ED Provider Notes (Signed)
[facility]   MRN: [id number] DOB: 07/15/1990  Subjective:   Alyssa Long is a 29 y.o. female presenting for 3-week history of intermittent right eye discomfort.  Patient states that the skin around her eye bothers her but not her eye itself.  Denies photophobia, eye pain, eye drainage, eye swelling.  Patient has started using new skin care products recently.  Has also had circular rash over his thighs and her back.  No current facility-administered medications for this encounter.  Current Outpatient Medications:  .  cetirizine-pseudoephedrine (ZYRTEC-D) 5-120 MG tablet, Take 1 tablet by mouth daily., Disp: 15 tablet, Rfl: 0 .  fluticasone (FLONASE) 50 MCG/ACT nasal spray, Place 2 sprays into both nostrils daily., Disp: 1 g, Rfl: 0   Allergies  Allergen Reactions  . Depo-Provera [Medroxyprogesterone Acetate] Other (See Comments)    Caused emotional distress/depression  . Shellfish Allergy     hives    Past Medical History:  Diagnosis Date  . Headache(784.0)   . Vaginal Pap smear, abnormal      Past Surgical History:  Procedure Laterality Date  . COLPOSCOPY      Family History  Problem Relation Age of Onset  . Diabetes Maternal Grandmother     Social History   Tobacco Use  . Smoking status: Never Smoker  . Smokeless tobacco: Never Used  Substance Use Topics  . Alcohol use: Yes    Alcohol/week: 1.0 standard drink    Types: 1 Glasses of wine per week  . Drug use: No    ROS   Objective:   Vitals: BP 103/67 (BP Location: Right Arm)   Pulse 72   Temp 99.6 F (37.6 C) (Oral)   Resp 14   LMP 01/21/2020   SpO2 97%   Physical Exam Constitutional:      General: She is not in acute distress.    Appearance: Normal appearance. She is well-developed. She is not ill-appearing.  HENT:     Head: Normocephalic and atraumatic.     Nose: Nose normal.     Mouth/Throat:     Mouth: Mucous membranes are moist.     Pharynx: Oropharynx is clear.    Eyes:     General: No scleral icterus.    Extraocular Movements: Extraocular movements intact.     Pupils: Pupils are equal, round, and reactive to light.   Cardiovascular:     Rate and Rhythm: Normal rate.  Pulmonary:     Effort: Pulmonary effort is normal.  Skin:    General: Skin is warm and dry.     Findings: Rash (annular lesions over right anterior thigh, posterior left thigh, mid back) present.  Neurological:     General: No focal deficit present.     Mental Status: She is alert and oriented to person, place, and time.  Psychiatric:        Mood and Affect: Mood normal.        Behavior: Behavior normal.      Assessment and Plan :   PDMP not reviewed this encounter.  1. 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 Rash and nonspecific skin eruption     Recommended patient stop the new skin care products she is using.  Use low potency hydrocortisone cream.  Recommended Diflucan for tinea corporis.  Discussed with patient that she could follow-up with a dermatologist for further work-up including official diagnosis as patient was concerned regarding definitive diagnoses. Counseled patient on  potential for adverse effects with medications prescribed/recommended today, ER and return-to-clinic precautions discussed, patient verbalized understanding.    Wallis Bamberg, PA-C 02/18/20 1447

## 2020-09-28 ENCOUNTER — Encounter (HOSPITAL_COMMUNITY): Payer: Self-pay

## 2020-09-28 ENCOUNTER — Other Ambulatory Visit: Payer: Self-pay

## 2020-09-28 ENCOUNTER — Ambulatory Visit (HOSPITAL_COMMUNITY)
Admission: EM | Admit: 2020-09-28 | Discharge: 2020-09-28 | Disposition: A | Discharge status: Home / Self Care | Payer: No Typology Code available for payment source | Attending: Internal Medicine | Admitting: Internal Medicine

## 2020-09-28 DIAGNOSIS — B379 Candidiasis, unspecified: Secondary | ICD-10-CM

## 2020-09-28 MED ORDER — HYDROCORTISONE 2.5 % EX CREA: TOPICAL_CREAM | Freq: Two times a day (BID) | CUTANEOUS | 0 refills | Status: DC

## 2020-09-28 MED ORDER — HYDROCORTISONE 1 % EX CREA: TOPICAL_CREAM | CUTANEOUS | 0 refills | Status: DC

## 2020-09-28 MED ORDER — NYSTATIN 100000 UNIT/GM EX POWD: 1.0000 "application " | Freq: Every day | CUTANEOUS | 0 refills | Status: DC

## 2020-09-28 MED ORDER — NYSTATIN 100000 UNIT/GM EX CREA: TOPICAL_CREAM | CUTANEOUS | 0 refills | Status: DC

## 2020-09-28 NOTE — ED Triage Notes (Signed)
Pt presents with recurring rash on chest around breast.

## 2020-09-28 NOTE — Discharge Instructions (Signed)
Follow up with your dermatologist if the dark skin pigment continues If the Nystatin cream does not help it, you need to follow with the hydrocortisone  cream

## 2021-01-31 ENCOUNTER — Other Ambulatory Visit: Payer: Self-pay

## 2021-01-31 ENCOUNTER — Encounter (HOSPITAL_COMMUNITY): Payer: Self-pay | Admitting: Emergency Medicine

## 2021-01-31 ENCOUNTER — Ambulatory Visit (HOSPITAL_COMMUNITY)
Admission: EM | Admit: 2021-01-31 | Discharge: 2021-01-31 | Disposition: A | Discharge status: Home / Self Care | Payer: Self-pay | Attending: Emergency Medicine | Admitting: Emergency Medicine

## 2021-01-31 NOTE — ED Notes (Signed)
Removed 5 stitches from left hand without any difficulty.
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
## 2021-07-21 ENCOUNTER — Encounter: Payer: Self-pay | Admitting: Physician Assistant

## 2021-07-21 ENCOUNTER — Ambulatory Visit (INDEPENDENT_AMBULATORY_CARE_PROVIDER_SITE_OTHER): Payer: Self-pay | Admitting: Physician Assistant

## 2021-07-21 ENCOUNTER — Other Ambulatory Visit: Payer: Self-pay

## 2021-07-21 DIAGNOSIS — L700 Acne vulgaris: Secondary | ICD-10-CM

## 2021-07-21 DIAGNOSIS — L821 Other seborrheic keratosis: Secondary | ICD-10-CM

## 2021-07-21 MED ORDER — TRETINOIN 0.025 % EX CREA: TOPICAL_CREAM | Freq: Every day | CUTANEOUS | 8 refills | Status: AC

## 2021-07-21 NOTE — Progress Notes (Signed)
? ?  New Patient ?  ?Subjective  ?Alyssa Long is a 31 y.o. female who presents for the following: New Patient (Initial Visit) (Patient here today for possible acne on her face x years no current treatment. ). ? ? ?The following portions of the chart were reviewed this encounter and updated as appropriate:  Tobacco  Allergies  Meds  Problems  Med Hx  Surg Hx  Fam Hx   ?  ? ?Objective  ?Well appearing patient in no apparent distress; mood and affect are within normal limits. ? ?A focused examination was performed including face. Relevant physical exam findings are noted in the Assessment and Plan. ? ?face and neck ?Small raised papules ? ?both  cheeks ?Open comedomes ? ? 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 ?No treatment  ? ?Acne vulgaris ?both  cheeks ? ?tretinoin (RETIN-A) 0.025 % cream - both  cheeks ?Apply topically at bedtime. ? ? ? ? ?I, Janayah Zavada, PA-C, have reviewed all documentation's for this visit.  The documentation on 07/21/21 for the exam, diagnosis, procedures and orders are all accurate and complete. ?

## 2021-08-23 ENCOUNTER — Encounter (HOSPITAL_COMMUNITY): Payer: Self-pay

## 2021-08-23 ENCOUNTER — Ambulatory Visit (HOSPITAL_COMMUNITY)
Admission: EM | Admit: 2021-08-23 | Discharge: 2021-08-23 | Disposition: A | Discharge status: Home / Self Care | Payer: Self-pay | Attending: Nurse Practitioner | Admitting: Nurse Practitioner

## 2021-08-23 DIAGNOSIS — N898 Other specified noninflammatory disorders of vagina: Secondary | ICD-10-CM | POA: Insufficient documentation

## 2021-08-23 DIAGNOSIS — R300 Dysuria: Secondary | ICD-10-CM | POA: Insufficient documentation

## 2021-08-23 LAB — POCT URINALYSIS DIPSTICK, ED / UC
Bilirubin Urine: NEGATIVE
Glucose, UA: NEGATIVE mg/dL
Hgb urine dipstick: NEGATIVE
Ketones, ur: NEGATIVE mg/dL
Nitrite: NEGATIVE
Protein, ur: NEGATIVE mg/dL
Specific Gravity, Urine: 1.025 (ref 1.005–1.030)
Urobilinogen, UA: 0.2 mg/dL (ref 0.0–1.0)
pH: 5.5 (ref 5.0–8.0)

## 2021-08-23 LAB — POC URINE PREG, ED: Preg Test, Ur: NEGATIVE

## 2021-08-23 MED ORDER — CEPHALEXIN 500 MG PO CAPS: 500.0000 mg | ORAL_CAPSULE | Freq: Two times a day (BID) | ORAL | 0 refills | Status: AC

## 2021-08-23 NOTE — ED Triage Notes (Signed)
Pt presents with abnormal vaginal discharge, vaginal odor, with some itching & irritation since yesterday.  ?

## 2021-08-23 NOTE — ED Provider Notes (Signed)
?[facility] ? ? ? ?CSN: [id number] ?Arrival date & time: 08/23/21  1233 ? ? ?  ? ?History   ?Chief Complaint ?Chief Complaint  ?Patient presents with  ? Vaginal Discharge  ? ? ?HPI ?Alyssa Long is a 31 y.o. female.  ? ?Patient presents for vaginal discharge that has been ongoing for about 1 week.  She reports sexual intercourse with a new partner and would like to be tested for STI.  She is also having some burning with urination and increased urinary frequency.  She denies hematuria, abdominal pain, nausea, and vomiting.   ? ? ?Past Medical History:  ?Diagnosis Date  ? Headache(784.0)   ? Vaginal Pap smear, abnormal   ? ? ?Patient Active Problem List  ? Diagnosis Date Noted  ? 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 DUB (dysfunctional uterine bleeding) 07/21/2016  ? LGSIL on Pap smear of cervix 07/07/2016  ? ? ?Past Surgical History:  ?Procedure Laterality Date  ? COLPOSCOPY    ? ? ?OB History   ? ? Gravida  ?2  ? Para  ?0  ? Term  ?0  ? Preterm  ?0  ? AB  ?2  ? Living  ?   ?  ? ? SAB  ?0  ? IAB  ?2  ? Ectopic  ?0  ? Multiple  ?   ? Live Births  ?   ?   ?  ?  ? ? ? ?Home Medications   ? ?Prior to Admission medications   ?Medication Sig Start Date End Date Taking? Authorizing Provider  ?cephALEXin (KEFLEX) 500 MG capsule Take 1 capsule (500 mg total) by mouth 2 (two) times daily for 5 days. 08/23/21 08/28/21 Yes Valentino Nose, NP  ?tretinoin (RETIN-A) 0.025 % cream Apply topically at bedtime. 07/21/21 07/21/22  Glyn Ade, PA-C  ? ? ?Family History ?Family History  ?Problem Relation Age of Onset  ? Diabetes Maternal Grandmother   ? ? ?Social History ?Social History  ? ?Tobacco Use  ? Smoking status: Never  ? Smokeless tobacco: Never  ?Substance Use Topics  ? Alcohol use: Yes  ?  Alcohol/week: 1.0 standard drink  ?  Types: 1 Glasses of wine per week  ? Drug use: No  ? ? ? ?Allergies   ?Depo-provera [medroxyprogesterone acetate] and Shellfish allergy ? ? ?Review of Systems ?Review of  Systems ?Per HPI ? ?Physical Exam ?Triage Vital Signs ?ED Triage Vitals  ?Enc Vitals Group  ?   BP 08/23/21 1417 121/81  ?   Pulse Rate 08/23/21 1417 76  ?   Resp 08/23/21 1417 18  ?   Temp 08/23/21 1417 98.7 ?F (37.1 ?C)  ?   Temp Source 08/23/21 1417 Oral  ?   SpO2 08/23/21 1417 96 %  ?   Weight --   ?   Height --   ?   Head Circumference --   ?   Peak Flow --   ?   Pain Score 08/23/21 1416 3  ?   Pain Loc --   ?   Pain Edu? --   ?   Excl. in GC? --   ? ?No data found. ? ?Updated Vital Signs ?BP 121/81 (BP Location: Right Arm)   Pulse 76   Temp 98.7 ?F (37.1 ?C) (Oral)   Resp 18   LMP 08/17/2021   SpO2 96%  ? ?Visual Acuity ?Right Eye Distance:   ?Left Eye Distance:   ?Bilateral  Distance:   ? ?Right Eye Near:   ?Left Eye Near:    ?Bilateral Near:    ? ?Physical Exam ?Vitals and nursing note reviewed.  ?Constitutional:   ?   General: She is not in acute distress. ?   Appearance: Normal appearance. She is not toxic-appearing.  ?Abdominal:  ?   General: Abdomen is flat. Bowel sounds are normal. There is no distension.  ?   Palpations: Abdomen is soft. There is no mass.  ?   Tenderness: There is no abdominal tenderness. There is no right CVA tenderness, left CVA tenderness or guarding.  ?Skin: ?   General: Skin is warm and dry.  ?   Coloration: Skin is not jaundiced or pale.  ?   Findings: No erythema.  ?Neurological:  ?   Mental Status: She is alert and oriented to person, place, and time.  ?   Gait: Gait normal.  ?Psychiatric:     ?   Behavior: Behavior is cooperative.  ? ? ? ?UC Treatments / Results  ?Labs ?(all labs ordered are listed, but only abnormal results are displayed) ?Labs Reviewed  ?POCT URINALYSIS DIPSTICK, ED / UC - Abnormal; Notable for the following components:  ?    Result Value  ? Leukocytes,Ua SMALL (*)   ? All other components within normal limits  ?URINE CULTURE  ?POC URINE PREG, ED  ?CERVICOVAGINAL ANCILLARY ONLY  ? ? ?EKG ? ? ?Radiology ?No results found. ? ?Procedures ?Procedures  (including critical care time) ? ?Medications Ordered in UC ?Medications - No data to display ? ?Initial Impression / Assessment and Plan / UC Course  ?I have reviewed the triage vital signs and the nursing notes. ? ?Pertinent labs & imaging results that were available during my care of the patient were reviewed by me and considered in my medical decision making (see chart for details). ? ?  ?UA today shows small amount of leukocytes.  Given patient is having symptoms, we will treat for UTI with cephalexin 500 mg twice daily for 5 days.  Send urine for culture.  We will also check self swab for gonorrhea, chlamydia, trichomonas, bacterial vaginosis, and yeast vaginitis.  Treat as indicated.  Encouraged condom use with every sexual encounter. ?Final Clinical Impressions(s) / UC Diagnoses  ? ?Final diagnoses:  ?Vaginal discharge  ?Dysuria  ? ? ? ?Discharge Instructions   ? ?  ?- Please start the antibiotics for the possible UTI ?-We will let you know if the culture results show that we need to use a different antibiotic ?-We will also let you know if the self swab requires any treatment ?-Please use condoms with every sexual encounter ? ? ? ? ?ED Prescriptions   ? ? Medication Sig Dispense Auth. Provider  ? cephALEXin (KEFLEX) 500 MG capsule Take 1 capsule (500 mg total) by mouth 2 (two) times daily for 5 days. 10 capsule Valentino Nose, NP  ? ?  ? ?PDMP not reviewed this encounter. ?  ?Valentino Nose, NP ?08/23/21 1452 ? ?

## 2021-08-23 NOTE — Discharge Instructions (Addendum)
-   Please start the antibiotics for the possible UTI ?-We will let you know if the culture results show that we need to use a different antibiotic ?-We will also let you know if the self swab requires any treatment ?-Please use condoms with every sexual encounter ?

## 2021-08-24 LAB — CERVICOVAGINAL ANCILLARY ONLY
Bacterial Vaginitis (gardnerella): POSITIVE — AB
Candida Glabrata: NEGATIVE
Candida Vaginitis: NEGATIVE
Chlamydia: NEGATIVE
Comment: NEGATIVE
Comment: NEGATIVE
Comment: NEGATIVE
Comment: NEGATIVE
Comment: NEGATIVE
Comment: NORMAL
Neisseria Gonorrhea: NEGATIVE
Trichomonas: NEGATIVE

## 2021-08-25 ENCOUNTER — Telehealth (HOSPITAL_COMMUNITY): Payer: Self-pay | Admitting: Emergency Medicine

## 2021-08-25 LAB — URINE CULTURE: Culture: <10000 — AB

## 2021-08-25 MED ORDER — METRONIDAZOLE 500 MG PO TABS: 500.0000 mg | ORAL_TABLET | Freq: Two times a day (BID) | ORAL | 0 refills | Status: DC

## 2021-12-27 ENCOUNTER — Ambulatory Visit (INDEPENDENT_AMBULATORY_CARE_PROVIDER_SITE_OTHER): Payer: Self-pay

## 2021-12-27 ENCOUNTER — Ambulatory Visit (INDEPENDENT_AMBULATORY_CARE_PROVIDER_SITE_OTHER): Payer: Self-pay | Admitting: Podiatry

## 2021-12-27 DIAGNOSIS — M778 Other enthesopathies, not elsewhere classified: Secondary | ICD-10-CM

## 2021-12-27 MED ORDER — METHYLPREDNISOLONE 4 MG PO TBPK: ORAL_TABLET | ORAL | 0 refills | Status: DC

## 2021-12-27 NOTE — Progress Notes (Signed)
  Subjective:  Patient ID: Alyssa Long, female    DOB: 05-Jun-1990,  MRN: [id number] HPI Chief Complaint  Patient presents with   Foot Problem     BIL ANKLE SWELLING / L HEEL IS SMALLER THAN  R - SELF PAY - AWARE OF COST, PATIENT WORKING OUT AND STARTED NOTICING THE SWELLING     31 y.o. female presents with the above complaint.   ROS: Denies fever chills nausea vomiting muscle aches pains calf pain back pain chest pain shortness of breath.  States that she has been working out a lot and she is trying to drink more water.  Notices that the swelling goes down when she drinks more water.  She is concerned about the thickness around her ankles  Past Medical History:  Diagnosis Date   Headache(784.0)    Vaginal Pap smear, abnormal    Past Surgical History:  Procedure Laterality Date   COLPOSCOPY      Current Outpatient Medications:    methylPREDNISolone (MEDROL DOSEPAK) 4 MG TBPK tablet, 6 day dose pack - take as directed, Disp: 21 tablet, Rfl: 0   tretinoin (RETIN-A) 0.025 % cream, Apply topically at bedtime., Disp: 45 g, Rfl: 8  Allergies  Allergen Reactions   Depo-Provera [Medroxyprogesterone Acetate] Other (See Comments)    Caused emotional distress/depression   Shellfish Allergy     hives   Review of Systems Objective:  There were no vitals filed for this visit.  General: Well developed, nourished, in no acute distress, alert and oriented x3   Dermatological: Skin is warm, dry and supple bilateral. Nails x 10 are well maintained; remaining integument appears unremarkable at this time. There are no open sores, no preulcerative lesions, no rash or signs of infection present.  Vascular: Dorsalis Pedis artery and Posterior Tibial artery pedal pulses are 2/4 bilateral with immedate capillary fill time. Pedal hair growth present. No varicosities and no lower extremity edema present bilateral.  She does have thickness around the ankles but does not demonstrate a true venous  insufficiency with pitting edema.  Most likely this is just some fluid retention.  She does have some tenderness on compression of the ankles.  Neruologic: Grossly intact via light touch bilateral. Vibratory intact via tuning fork bilateral. Protective threshold with Semmes Wienstein monofilament intact to all pedal sites bilateral. Patellar and Achilles deep tendon reflexes 2+ bilateral. No Babinski or clonus noted bilateral.   Musculoskeletal: No gross boney pedal deformities bilateral. No pain, crepitus, or limitation noted with foot and ankle range of motion bilateral. Muscular strength 5/5 in all groups tested bilateral.  Gait: Unassisted, Nonantalgic.    Radiographs:  Radiographs taken today demonstrate an osseously mature individual no acute findings she does have some soft tissue swelling but not within the tendon sheaths are the tendons.  The majority of it appears to be in the skin and subcutaneous tissue.  No calcification identified.  Assessment & Plan:   Assessment: Mild edema bilaterally.  Capsulitis possible subtalar joint.  Plan: Start her on a Medrol Dosepak to see if this would alleviate her symptoms she is going to continue to work out we discussed appropriate shoes we also discussed that her drinking more water to help wash out any impurities.  Follow-up with her in a few weeks if necessary.     Abimbola Aki T. [place], [place]

## 2022-02-07 ENCOUNTER — Ambulatory Visit: Payer: Self-pay | Admitting: Podiatry

## 2022-03-05 ENCOUNTER — Encounter (HOSPITAL_COMMUNITY): Payer: Self-pay | Admitting: Emergency Medicine

## 2022-03-05 ENCOUNTER — Encounter (HOSPITAL_COMMUNITY): Payer: Self-pay | Admitting: *Deleted

## 2022-03-05 ENCOUNTER — Emergency Department (HOSPITAL_COMMUNITY)
Admission: EM | Admit: 2022-03-05 | Discharge: 2022-03-05 | Discharge status: Against Medical Advice | Payer: Self-pay | Attending: Emergency Medicine | Admitting: Emergency Medicine

## 2022-03-05 ENCOUNTER — Ambulatory Visit (HOSPITAL_COMMUNITY)
Admission: EM | Admit: 2022-03-05 | Discharge: 2022-03-05 | Disposition: A | Discharge status: Home / Self Care | Payer: Self-pay | Attending: Internal Medicine | Admitting: Internal Medicine

## 2022-03-05 ENCOUNTER — Other Ambulatory Visit: Payer: Self-pay

## 2022-03-05 DIAGNOSIS — S51832A Puncture wound without foreign body of left forearm, initial encounter: Secondary | ICD-10-CM | POA: Insufficient documentation

## 2022-03-05 DIAGNOSIS — W260XXA Contact with knife, initial encounter: Secondary | ICD-10-CM | POA: Insufficient documentation

## 2022-03-05 DIAGNOSIS — S61512A Laceration without foreign body of left wrist, initial encounter: Secondary | ICD-10-CM

## 2022-03-05 DIAGNOSIS — Z23 Encounter for immunization: Secondary | ICD-10-CM

## 2022-03-05 DIAGNOSIS — Z5321 Procedure and treatment not carried out due to patient leaving prior to being seen by health care provider: Secondary | ICD-10-CM | POA: Insufficient documentation

## 2022-03-05 MED ORDER — TETANUS-DIPHTH-ACELL PERTUSSIS 5-2.5-18.5 LF-MCG/0.5 IM SUSY
0.5000 mL | PREFILLED_SYRINGE | Freq: Once | INTRAMUSCULAR | Status: AC
  Administered 2022-03-05: 0.5 mL via INTRAMUSCULAR

## 2022-03-05 MED ORDER — TETANUS-DIPHTH-ACELL PERTUSSIS 5-2.5-18.5 LF-MCG/0.5 IM SUSY
PREFILLED_SYRINGE | INTRAMUSCULAR | Status: AC
  Filled 2022-03-05: qty 0.5

## 2022-03-05 NOTE — ED Triage Notes (Signed)
Patient here with complaint of 1 cm well-approximated wound on her left forearm that occurred when she accidentally punctured her arm with a small knife. Patient states "it was just a poke" and was not deep. Bleeding is controlled, patient states last tetanus vaccine in high school, patient is alert, oriented, and in no apparent distress at this time.

## 2022-03-05 NOTE — ED Provider Notes (Addendum)
[facility]    CSN: [id number] Arrival date & time: 03/05/22  1128      History   Chief Complaint Chief Complaint  Patient presents with   break in skin    HPI Alyssa Long is a 31 y.o. female.   Patient presents urgent care for evaluation of laceration to the left wrist that happened this morning when she accidentally cut herself with a kitchen knife.  Laceration blood initially but the bleeding stopped quickly with pressure.  Unknown last date of tetanus shot.  Laceration is very superficial and patient is very concerned about wound healing and would like this laceration to be repaired in the clinic so that the wound will "heal faster".  She does not have any medical history putting her at increased risk for delayed wound healing.  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  No pain to the laceration at this time.  She has full range of motion of her left wrist and full sensation in her left hand distally to the injury.     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 Authorizing Provider  methylPREDNISolone (MEDROL DOSEPAK) 4 MG TBPK tablet 6 day dose pack - take as directed 12/27/21   Hyatt, Max T, DPM  tretinoin (RETIN-A) 0.025 % cream Apply topically at bedtime. 07/21/21 07/21/22   Glyn Ade, PA-C    Family History Family History  Problem Relation Age of Onset   Diabetes Maternal Grandmother     Social History Social History   Tobacco Use   Smoking status: Never   Smokeless tobacco: Never  Substance Use Topics   Alcohol use: Yes    Alcohol/week: 1.0 standard drink of alcohol    Types: 1 Glasses of wine per week   Drug use: No     Allergies   Depo-provera [medroxyprogesterone acetate] and Shellfish allergy   Review of Systems Review of Systems Per HPI  Physical Exam Triage Vital Signs ED Triage Vitals  Enc Vitals Group     BP 03/05/22 1255 (!) 129/59     Pulse Rate 03/05/22 1255 84     Resp 03/05/22 1255 18     Temp 03/05/22 1255 (!) 97.2 F (36.2 C)     Temp src --      SpO2 03/05/22 1255 99 %     Weight --      Height --      Head Circumference --  Peak Flow --      Pain Score 03/05/22 1256 0     Pain Loc --      Pain Edu? --      Excl. in [place]? --    No data found.  Updated Vital Signs BP (!) 129/59   Pulse 84   Temp (!) 97.2 F (36.2 C)   Resp 18   SpO2 99%   Visual Acuity Right Eye Distance:   Left Eye Distance:   Bilateral Distance:    Right Eye Near:   Left Eye Near:    Bilateral Near:     Physical Exam Vitals and nursing note reviewed.  Constitutional:      Appearance: She is not ill-appearing or toxic-appearing.  HENT:     Head: Normocephalic and atraumatic.     Right Ear: Hearing and external ear normal.     Left Ear: Hearing and external ear normal.     Nose: Nose normal.     Mouth/Throat:     Lips: Pink.  Eyes:     General: Lids are normal. Vision grossly intact. Gaze aligned appropriately.     Extraocular Movements: Extraocular movements intact.     Conjunctiva/sclera: Conjunctivae normal.  Pulmonary:     Effort: Pulmonary effort is normal.  Musculoskeletal:     Cervical back: Neck supple.  Skin:    General: Skin is warm and dry.     Capillary Refill: Capillary refill takes less  than 2 seconds.     Findings: No rash.     Comments: See image of laceration below for further detail.  There is no warmth or erythema surrounding laceration.  Laceration is very superficial and there is no indication to close this at this time.  Neurovascularly intact distal to injury.  Neurological:     General: No focal deficit present.     Mental Status: She is alert and oriented to person, place, and time. Mental status is at baseline.     Cranial Nerves: No dysarthria or facial asymmetry.  Psychiatric:        Mood and Affect: Mood normal.        Speech: Speech normal.        Behavior: Behavior normal.        Thought Content: Thought content normal.        Judgment: Judgment normal.       UC Treatments / Results  Labs (all labs ordered are listed, but only abnormal results are displayed) Labs Reviewed - No data to display  EKG   Radiology No results found.  Procedures Procedures (including critical care time)  Medications Ordered in UC Medications  Tdap (BOOSTRIX) injection 0.5 mL (has no administration in time range)    Initial Impression / Assessment and Plan / UC Course  I have reviewed the triage vital signs and the nursing notes.  Pertinent labs & imaging results that were available during my care of the patient were reviewed by me and considered in my medical decision making (see chart for details).   1.  Laceration of left wrist Laceration is extremely superficial and therefore there is no clinical indication for laceration repair at this time.  Discussed findings with patient who expresses agreement and understanding of plan.  Offered Steri-Strip to the laceration to help keep it well approximated, but patient would rather go with the option of triple antibiotic ointment and bandage to cover the laceration while she is at work.  Patient may go back to  work today.  She is neurovascularly intact distal to injury and there is no clinical indication for imaging at  this time as there is very low suspicion for foreign body.  Tetanus injection updated in clinic today.  Discussed physical exam and available lab work findings in clinic with patient.  Counseled patient regarding appropriate use of medications and potential side effects for all medications recommended or prescribed today. Discussed red flag signs and symptoms of worsening condition,when to call the PCP office, return to urgent care, and when to seek higher level of care in the emergency department. Patient verbalizes understanding and agreement with plan. All questions answered. Patient discharged in stable condition.    Final Clinical Impressions(s) / UC Diagnoses   Final diagnoses:  Laceration of left wrist, initial encounter     Discharge Instructions      Your laceration is very superficial meaning that it will heal very quickly.  Purchase over-the-counter triple antibiotic ointment and place a Band-Aid over the laceration to help it heal.  Change the Band-Aid and apply triple antibiotic ointment twice daily until the laceration heals.  You may go back to work today.   If you notice any worsening signs of infection such as white/yellow drainage, redness around the wound, warmth around the wound, or any new or worsening symptoms, please return to urgent care for reevaluation.   If you develop any new or worsening symptoms or do not improve in the next 2 to 3 days, please return.  If your symptoms are severe, please go to the emergency room.  Follow-up with your primary care provider for further evaluation and management of your symptoms as well as ongoing wellness visits.  I hope you feel better!     ED Prescriptions   None    PDMP not reviewed this encounter.   Talbot Grumbling, FNP 03/05/22 [place], [place], FNP 03/05/22 1321    Talbot Grumbling, [place] 03/05/22 1321

## 2022-03-05 NOTE — ED Triage Notes (Signed)
Pt presents with a <0.5cm skin break on Lt anterior forearm.. 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

## 2022-03-05 NOTE — ED Notes (Signed)
Pt handed this NT labels and notified this NT that pt was leaving and going to UC.

## 2022-03-05 NOTE — Discharge Instructions (Signed)
Your laceration is very superficial meaning that it will heal very quickly.  Purchase over-the-counter triple antibiotic ointment and place a Band-Aid over the laceration to help it heal.  Change the Band-Aid and apply triple antibiotic ointment twice daily until the laceration heals.  You may go back to work today.   If you notice any worsening signs of infection such as white/yellow drainage, redness around the wound, warmth around the wound, or any new or worsening symptoms, please return to urgent care for reevaluation.   If you develop any new or worsening symptoms or do not improve in the next 2 to 3 days, please return.  If your symptoms are severe, please go to the emergency room.  Follow-up with your primary care provider for further evaluation and management of your symptoms as well as ongoing wellness visits.  I hope you feel better!

## 2022-04-26 ENCOUNTER — Emergency Department (HOSPITAL_COMMUNITY)
Admission: EM | Admit: 2022-04-26 | Discharge: 2022-04-27 | Disposition: A | Discharge status: Home / Self Care | Payer: Self-pay | Attending: Emergency Medicine | Admitting: Emergency Medicine

## 2022-04-26 ENCOUNTER — Emergency Department (HOSPITAL_COMMUNITY): Payer: Self-pay

## 2022-04-26 ENCOUNTER — Ambulatory Visit: Payer: Self-pay | Admitting: *Deleted

## 2022-04-26 ENCOUNTER — Other Ambulatory Visit: Payer: Self-pay

## 2022-04-26 DIAGNOSIS — N9489 Other specified conditions associated with female genital organs and menstrual cycle: Secondary | ICD-10-CM | POA: Insufficient documentation

## 2022-04-26 DIAGNOSIS — R1030 Lower abdominal pain, unspecified: Secondary | ICD-10-CM | POA: Insufficient documentation

## 2022-04-26 DIAGNOSIS — N946 Dysmenorrhea, unspecified: Secondary | ICD-10-CM

## 2022-04-26 DIAGNOSIS — R197 Diarrhea, unspecified: Secondary | ICD-10-CM | POA: Insufficient documentation

## 2022-04-26 DIAGNOSIS — R1110 Vomiting, unspecified: Secondary | ICD-10-CM | POA: Insufficient documentation

## 2022-04-26 LAB — CBC WITH DIFFERENTIAL/PLATELET
Abs Immature Granulocytes: 0.14 10*3/uL — ABNORMAL HIGH (ref 0.00–0.07)
Basophils Absolute: 0 10*3/uL (ref 0.0–0.1)
Basophils Relative: 0 %
Eosinophils Absolute: 0 10*3/uL (ref 0.0–0.5)
Eosinophils Relative: 0 %
HCT: 40.5 % (ref 36.0–46.0)
Hemoglobin: 13.3 g/dL (ref 12.0–15.0)
Immature Granulocytes: 1 %
Lymphocytes Relative: 3 %
Lymphs Abs: 0.6 10*3/uL — ABNORMAL LOW (ref 0.7–4.0)
MCH: 29.7 pg (ref 26.0–34.0)
MCHC: 32.8 g/dL (ref 30.0–36.0)
MCV: 90.4 fL (ref 80.0–100.0)
Monocytes Absolute: 0.9 10*3/uL (ref 0.1–1.0)
Monocytes Relative: 4 %
Neutro Abs: 21 10*3/uL — ABNORMAL HIGH (ref 1.7–7.7)
Neutrophils Relative %: 92 %
Platelets: 180 10*3/uL (ref 150–400)
RBC: 4.48 MIL/uL (ref 3.87–5.11)
RDW: 12.8 % (ref 11.5–15.5)
WBC: 22.7 10*3/uL — ABNORMAL HIGH (ref 4.0–10.5)
nRBC: 0 % (ref 0.0–0.2)

## 2022-04-26 LAB — COMPREHENSIVE METABOLIC PANEL
ALT: 12 U/L (ref 0–44)
AST: 15 U/L (ref 15–41)
Albumin: 4.3 g/dL (ref 3.5–5.0)
Alkaline Phosphatase: 30 U/L — ABNORMAL LOW (ref 38–126)
Anion gap: 9 (ref 5–15)
BUN: 8 mg/dL (ref 6–20)
CO2: 26 mmol/L (ref 22–32)
Calcium: 9.3 mg/dL (ref 8.9–10.3)
Chloride: 98 mmol/L (ref 98–111)
Creatinine, Ser: 0.64 mg/dL (ref 0.44–1.00)
GFR, Estimated: >60 mL/min (ref >60)
Glucose, Bld: 101 mg/dL — ABNORMAL HIGH (ref 70–99)
Potassium: 3.9 mmol/L (ref 3.5–5.1)
Sodium: 133 mmol/L — ABNORMAL LOW (ref 135–145)
Total Bilirubin: 0.8 mg/dL (ref 0.3–1.2)
Total Protein: 7.6 g/dL (ref 6.5–8.1)

## 2022-04-26 LAB — LIPASE, BLOOD: Lipase: 27 U/L (ref 11–51)

## 2022-04-26 LAB — URINALYSIS, ROUTINE W REFLEX MICROSCOPIC
Bilirubin Urine: NEGATIVE
Glucose, UA: NEGATIVE mg/dL
Ketones, ur: 20 mg/dL — AB
Leukocytes,Ua: NEGATIVE
Nitrite: NEGATIVE
Protein, ur: 30 mg/dL — AB
Specific Gravity, Urine: 1.026 (ref 1.005–1.030)
pH: 6 (ref 5.0–8.0)

## 2022-04-26 LAB — HCG, QUANTITATIVE, PREGNANCY: hCG, Beta Chain, Quant, S: 1 m[IU]/mL (ref <5)

## 2022-04-26 MED ORDER — IBUPROFEN 800 MG PO TABS: 800.0000 mg | ORAL_TABLET | Freq: Three times a day (TID) | ORAL | 0 refills | Status: DC

## 2022-04-26 MED ORDER — SODIUM CHLORIDE 0.9 % IV BOLUS
1000.0000 mL | Freq: Once | INTRAVENOUS | Status: AC
  Administered 2022-04-26: 1000 mL via INTRAVENOUS

## 2022-04-26 MED ORDER — ONDANSETRON HCL 4 MG/2ML IJ SOLN
4.0000 mg | Freq: Once | INTRAMUSCULAR | Status: AC
  Administered 2022-04-26: 4 mg via INTRAVENOUS
  Filled 2022-04-26: qty 2

## 2022-04-26 MED ORDER — KETOROLAC TROMETHAMINE 30 MG/ML IJ SOLN
30.0000 mg | Freq: Once | INTRAMUSCULAR | Status: AC
  Administered 2022-04-26: 30 mg via INTRAVENOUS
  Filled 2022-04-26: qty 1

## 2022-04-26 MED ORDER — IOHEXOL 350 MG/ML SOLN
75.0000 mL | Freq: Once | INTRAVENOUS | Status: AC | PRN
  Administered 2022-04-26: 75 mL via INTRAVENOUS

## 2022-04-26 MED ORDER — ONDANSETRON 4 MG PO TBDP: ORAL_TABLET | ORAL | 0 refills | Status: DC

## 2022-04-26 NOTE — Discharge Instructions (Signed)
You likely have a stomach virus. Take zofran for nausea and stay hydrated   You also have menstrual cramps. Take motrin for cramps   See your doctor for follow up   Return to ER if you have worse abdominal pain, vomiting, fever

## 2022-04-26 NOTE — ED Triage Notes (Signed)
N/v/d with abdominal pain and pain into her pelvic area.  No fever/chills.  No tenderness to palpation.  States feels like "menstrual cramps x 10"

## 2022-04-26 NOTE — ED Provider Notes (Signed)
[facility] EMERGENCY DEPARTMENT Provider Note   CSN: [id number] Arrival date & time: 04/26/22  1306     History  Chief Complaint  Patient presents with   Pelvic Pain    AVYNN SPLANE is a 31 y.o. female here with burning with abdominal pain and vomiting and diarrhea.  Patient states that she started vomiting since yesterday.  She also has some watery stools as well.  Patient has lower abdominal pain and cramps.  Patient states that it is constant and radiates to her back.  Denies any dysuria or hematuria.  The history is provided by the patient.       Home Medications Prior to Admission medications   Medication Sig Start Date End Date Taking? Authorizing Provider  methylPREDNISolone (MEDROL DOSEPAK) 4 MG TBPK tablet 6 day dose pack - take as directed Patient not taking: Reported on 04/26/2022 12/27/21   Hyatt, Max T, DPM  tretinoin (RETIN-A) 0.025 % cream Apply topically at bedtime. Patient not taking: Reported on 04/26/2022 07/21/21 07/21/22  Robyne Askew R, PA-C      Allergies    Depo-provera [medroxyprogesterone acetate] and Shellfish allergy    Review of Systems   Review of Systems  Genitourinary:  Positive for pelvic pain.  All other systems reviewed and are negative.   Physical Exam Updated Vital Signs BP 110/63 (BP Location: Left Arm)   Pulse 97   Temp 99.8 F (37.7 C) (Oral)   Resp 18   SpO2 97%  Physical Exam Vitals and nursing note reviewed.  Constitutional:      Comments: Dehydrated and uncomfortable  HENT:     Head: Normocephalic.     Nose: Nose normal.     Mouth/Throat:     Mouth: Mucous membranes are dry.  Eyes:     Extraocular Movements: Extraocular movements intact.     Pupils: Pupils are equal, round, and reactive to light.  Cardiovascular:     Rate and Rhythm: Normal rate and regular rhythm.     Pulses: Normal pulses.     Heart sounds: Normal heart sounds.  Pulmonary:     Effort: Pulmonary effort is normal.      Breath sounds: Normal breath sounds.  Abdominal:     General: Abdomen is flat.     Palpations: Abdomen is soft.     Comments: Mild diffuse lower abdominal tenderness.  No rebound or guarding  Genitourinary:    Comments: Bloody discharge, no obvious CMT or adnexal tenderness  Musculoskeletal:        General: Normal range of motion.     Cervical back: Normal range of motion and neck supple.  Skin:    General: Skin is warm.     Capillary Refill: Capillary refill takes less than 2 seconds.  Neurological:     General: No focal deficit present.     Mental Status: She is oriented to person, place, and time.  Psychiatric:        Mood and Affect: Mood normal.        Behavior: Behavior normal.     ED Results / Procedures / Treatments   Labs (all labs ordered are listed, but only abnormal results are displayed) Labs Reviewed  CBC WITH DIFFERENTIAL/PLATELET - Abnormal; Notable for the following components:      Result Value   WBC 22.7 (*)    Neutro Abs 21.0 (*)    Lymphs Abs 0.6 (*)    Abs Immature Granulocytes 0.14 (*)    All  other components within normal limits  COMPREHENSIVE METABOLIC PANEL - Abnormal; Notable for the following components:   Sodium 133 (*)    Glucose, Bld 101 (*)    Alkaline Phosphatase 30 (*)    All other components within normal limits  URINALYSIS, ROUTINE W REFLEX MICROSCOPIC - Abnormal; Notable for the following components:   APPearance HAZY (*)    Hgb urine dipstick SMALL (*)    Ketones, ur 20 (*)    Protein, ur 30 (*)    Bacteria, UA RARE (*)    All other components within normal limits  LIPASE, BLOOD  HCG, QUANTITATIVE, PREGNANCY    EKG None  Radiology No results found.  Procedures Procedures    Medications Ordered in ED Medications  sodium chloride 0.9 % bolus 1,000 mL (0 mLs Intravenous Stopped 04/26/22 2152)  ondansetron (ZOFRAN) injection 4 mg (4 mg Intravenous Given 04/26/22 2113)  ketorolac (TORADOL) 30 MG/ML injection 30 mg (30 mg  Intravenous Given 04/26/22 2113)    ED Course/ Medical Decision Making/ A&P                           Medical Decision Making MAIZEY MENENDEZ is a 31 y.o. female here presenting with lower abdominal pain and vomiting and diarrhea.  Likely viral gastroenteritis.  Consider appendicitis as well.  Plan to get CBC and CMP and urinalysis and CT abdomen pelvis.  Will hydrate and reassess.  11:18 PM WBC is 22. Patient had an episode of vaginal bleeding while on CT table and now feels better. Pelvic exam showed bloody discharge, wet prep sent. Unfortunately, wet prep spilled in the lab and they are unable to run it. Patient states that she has no vaginal discharge, last sex was about a month ago. CT showed fluid collection in the lower uterine segment consistent with menstrual blood. I think she likely has gastroenteritis and just so happen to be on her menses. Will dc home with zofran, motrin   Problems Addressed: Menstrual cramp: acute illness or injury Vomiting and diarrhea: acute illness or injury  Amount and/or Complexity of Data Reviewed Labs: ordered. Decision-making details documented in ED Course. Radiology: ordered and independent interpretation performed. Decision-making details documented in ED Course.  Risk Prescription drug management.    Final Clinical Impression(s) / ED Diagnoses Final diagnoses:  None    Rx / DC Orders ED Discharge Orders     None         Charlynne Pander, MD 04/26/22 [id number]

## 2022-04-26 NOTE — ED Notes (Signed)
MD at bedside for pelvic exam with female RN as chaperone.

## 2022-04-26 NOTE — Telephone Encounter (Signed)
Reason for Disposition  [1] SEVERE abdominal pain (e.g., excruciating) AND [2] present > 1 hour  Answer Assessment - Initial Assessment Questions 1. DIARRHEA SEVERITY: "How bad is the diarrhea?" "How many more stools have you had in the past 24 hours than normal?"    - NO DIARRHEA (SCALE 0)   - MILD (SCALE 1-3): Few loose or mushy BMs; increase of 1-3 stools over normal daily number of stools; mild increase in ostomy output.   -  MODERATE (SCALE 4-7): Increase of 4-6 stools daily over normal; moderate increase in ostomy output.   -  SEVERE (SCALE 8-10; OR "WORST POSSIBLE"): Increase of 7 or more stools daily over normal; moderate increase in ostomy output; incontinence.     4 2. ONSET: "When did the diarrhea begin?"      Last night 3. BM CONSISTENCY: "How loose or watery is the diarrhea?"      Watery 4. VOMITING: "Are you also vomiting?" If Yes, ask: "How many times in the past 24 hours?"      no 5. ABDOMEN PAIN: "Are you having any abdomen pain?" If Yes, ask: "What does it feel like?" (e.g., crampy, dull, intermittent, constant)      Cramping and low back pain, constant 6. ABDOMEN PAIN SEVERITY: If present, ask: "How bad is the pain?"  (e.g., Scale 1-10; mild, moderate, or severe)   - MILD (1-3): doesn't interfere with normal activities, abdomen soft and not tender to touch    - MODERATE (4-7): interferes with normal activities or awakens from sleep, abdomen tender to touch    - SEVERE (8-10): excruciating pain, doubled over, unable to do any normal activities       10/10 7. ORAL INTAKE: If vomiting, "Have you been able to drink liquids?" "How much liquids have you had in the past 24 hours?"     NA 8. HYDRATION: "Any signs of dehydration?" (e.g., dry mouth [not just dry lips], too weak to stand, dizziness, new weight loss) "When did you last urinate?"     Not really 10. ANTIBIOTIC USE: "Are you taking antibiotics now or have you taken antibiotics in the past 2 months?"       no 11.  OTHER SYMPTOMS: "Do you have any other symptoms?" (e.g., fever, blood in stool)       Temp, "Hot and dizzy"  Protocols used: Diarrhea-A-AH

## 2022-04-26 NOTE — Telephone Encounter (Signed)
  Chief Complaint: Diarrhea Symptoms: 4xs since this AM, "Explosive" watery. !0/10 abdominal and lower back pain, worsening since last night.States fever and dizzy, not drinking Frequency: Last night Pertinent Negatives: Patient denies  Disposition: [x] ED /[] Urgent Care (no appt availability in office) / [] Appointment(In office/virtual)/ []  [facility]/ [] Home Care/ [] Refused Recommended Disposition /[] [facility]/ []  Follow-up with PCP Additional Notes: Pt tearful, sounds distressed. Advised ED, states will follow disposition. Care advise provided, verbalizes understanding.

## 2022-04-26 NOTE — ED Notes (Signed)
At this time CT tech came to inform this RN that the pt had a large amount of blood come out of her vagina right before being moved over to the CT table. CT tech stated that the pt was not making sense with her words. This RN went to CT to assess pt and situation. Upon my arrival to CT, pt was A/Ox4. Pt did have a large amount of dark red fluid mixed with "coffee ground" substance in her pelvic area and on the stretcher. Pt cleaned up and sheets changed. MD informed of situation.

## 2022-04-26 NOTE — ED Notes (Signed)
Patient transported to CT 

## 2022-04-26 NOTE — ED Provider Triage Note (Signed)
Emergency Medicine Provider Triage Evaluation Note  Alyssa Long , a 31 y.o. female  was evaluated in triage.  Pt complains of nausea, vomiting, and diarrhea since last night around 2145. The patient reports that she has had watery stool. Reports some blood when she wipes. Denies any coffee ground emesis or hematemeiss. Denies any urinary symptoms She reports that she has pain in her lower abdomen and in her lower back. She rreports that now the pain feels that it is in her labia. Denies any urianry or vaginal symptoms. Denies any fever.  Review of Systems  Positive:  Negative:   Physical Exam  There were no vitals taken for this visit. Gen:   Awake, no distress   Resp:  Normal effort  MSK:   Moves extremities without difficulty  Other:  Abdomen soft and non tender. No CVA tenderness to palpation.   Medical Decision Making  Medically screening exam initiated at 3:31 PM.  Appropriate orders placed.  Alyssa Long was informed that the remainder of the evaluation will be completed by another provider, this initial triage assessment does not replace that evaluation, and the importance of remaining in the ED until their evaluation is complete.  Labs ordered.    Achille Rich, [place] 04/26/22 1533

## 2022-04-26 NOTE — ED Triage Notes (Signed)
Pt BIB [facility] from home c/o some lower abdominal pain and lower back pain that started at 945 pm last night.    130/80 76 CBG 120

## 2022-04-27 ENCOUNTER — Ambulatory Visit: Payer: Self-pay | Admitting: Family Medicine

## 2022-04-27 NOTE — ED Notes (Signed)
Discharge instructions reviewed with pt prior to discharge. Pt verbalizes understanding of teaching. Belongings with pt upon depart. Pt taken to lobby in wheelchair to wait for ride.

## 2022-05-12 ENCOUNTER — Ambulatory Visit (HOSPITAL_COMMUNITY)
Admission: EM | Admit: 2022-05-12 | Discharge: 2022-05-12 | Disposition: A | Discharge status: Home / Self Care | Payer: Self-pay | Attending: Internal Medicine | Admitting: Internal Medicine

## 2022-05-12 ENCOUNTER — Encounter (HOSPITAL_COMMUNITY): Payer: Self-pay | Admitting: Emergency Medicine

## 2022-05-12 DIAGNOSIS — J069 Acute upper respiratory infection, unspecified: Secondary | ICD-10-CM

## 2022-05-12 DIAGNOSIS — H6123 Impacted cerumen, bilateral: Secondary | ICD-10-CM

## 2022-05-12 DIAGNOSIS — R0981 Nasal congestion: Secondary | ICD-10-CM

## 2022-05-12 MED ORDER — BENZONATATE 100 MG PO CAPS: 100.0000 mg | ORAL_CAPSULE | Freq: Three times a day (TID) | ORAL | 0 refills | Status: DC

## 2022-05-12 NOTE — ED Provider Notes (Signed)
[facility]    CSN: [id number] Arrival date & time: 05/12/22  1523      History   Chief Complaint No chief complaint on file.   HPI Alyssa Long is a 31 y.o. female.   Patient presents urgent care for evaluation of sore throat, stuffy nose, cough, and bodyaches for the last 5 days.  She believes she may have the flu.  No known sick contacts with similar symptoms.  Reports low-grade fever few days ago but has not had a fever or chills since.  She is no longer experiencing sore throat or body aches but reports nasal congestion and cough.  She states symptoms have improved every day over the last 5 days since onset.  She is not a smoker and denies drug use.  Denies history of chronic medical problems/respiratory problems.  No recent antibiotic or steroid use.  She would like a note so that she does not have to go to work tomorrow because she states that she still feels tired and would like another day to recover from illness.  She has not taken any over-the-counter medicines before coming to urgent care and states that she has been trying to drink plenty of water and eat nutritious foods in order to get better.  Denies nausea, vomiting, shortness of breath, chest pain, dizziness, abdominal pain, urinary symptoms, and headaches.     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  Authorizing Provider  benzonatate (TESSALON) 100 MG capsule Take 1 capsule (100 mg total) by mouth every 8 (eight) hours. 05/12/22  Yes Carlisle Beers, FNP  ibuprofen (ADVIL) 800 MG tablet Take 1 tablet (800 mg total) by mouth 3 (three) times daily. 04/26/22   Charlynne Pander, MD  methylPREDNISolone (MEDROL DOSEPAK) 4 MG TBPK tablet 6 day dose pack - take as directed Patient not taking: Reported on 04/26/2022 12/27/21   Ernestene Kiel T, DPM  ondansetron (ZOFRAN-ODT) 4 MG disintegrating tablet 4mg  ODT q4 hours prn nausea/vomit 04/26/22   14/6/23, MD  tretinoin (RETIN-A) 0.025 % cream Apply topically at bedtime. Patient not taking: Reported on 04/26/2022 07/21/21 07/21/22  09/20/22, PA-C    Family History Family History  Problem Relation Age of Onset   Diabetes Maternal Grandmother     Social History Social History   Tobacco Use   Smoking status: Never   Smokeless tobacco: Never  Substance Use Topics   Alcohol use: Yes    Alcohol/week: 1.0 standard drink of alcohol    Types: 1 Glasses of wine per week  Drug use: No     Allergies   Depo-provera [medroxyprogesterone acetate] and Shellfish allergy   Review of Systems Review of Systems Per HPI  Physical Exam Triage Vital Signs ED Triage Vitals  Enc Vitals Group     BP 05/12/22 1700 125/82     Pulse Rate 05/12/22 1700 74     Resp 05/12/22 1700 16     Temp 05/12/22 1700 97.9 F (36.6 C)     Temp Source 05/12/22 1700 Oral     SpO2 05/12/22 1700 99 %     Weight --      Height --      Head Circumference --      Peak Flow --      Pain Score 05/12/22 1702 4     Pain Loc --      Pain Edu? --      Excl. in GC? --    No data found.  Updated Vital Signs BP 125/82 (BP Location: Right Arm)   Pulse 74   Temp 97.9 F (36.6 C) (Oral)   Resp 16   LMP 04/26/2022   SpO2 99%   Visual Acuity Right Eye Distance:   Left Eye Distance:   Bilateral Distance:    Right Eye Near:   Left Eye Near:     Bilateral Near:     Physical Exam Vitals and nursing note reviewed.  Constitutional:      Appearance: Normal appearance. She is not ill-appearing or toxic-appearing.  HENT:     Head: Normocephalic and atraumatic.     Right Ear: Hearing, ear canal and external ear normal. There is impacted cerumen.     Left Ear: Hearing, ear canal and external ear normal. There is impacted cerumen.     Nose: Congestion present.     Mouth/Throat:     Lips: Pink.     Mouth: Mucous membranes are moist.     Pharynx: No posterior oropharyngeal erythema.  Eyes:     General: Lids are normal. Vision grossly intact. Gaze aligned appropriately.     Extraocular Movements: Extraocular movements intact.     Conjunctiva/sclera: Conjunctivae normal.     Pupils: Pupils are equal, round, and reactive to light.  Cardiovascular:     Rate and Rhythm: Normal rate and regular rhythm.     Heart sounds: Normal heart sounds, S1 normal and S2 normal.  Pulmonary:     Effort: Pulmonary effort is normal. No respiratory distress.     Breath sounds: Normal breath sounds and air entry.  Abdominal:     General: Abdomen is flat. Bowel sounds are normal.     Palpations: Abdomen is soft.     Tenderness: There is no abdominal tenderness.  Musculoskeletal:     Cervical back: Neck supple.  Lymphadenopathy:     Cervical: No cervical adenopathy.  Skin:    General: Skin is warm and dry.     Capillary Refill: Capillary refill takes less than 2 seconds.     Findings: No rash.  Neurological:     General: No focal deficit present.     Mental Status: She is alert and oriented to person, place, and time. Mental status is at baseline.     Cranial Nerves: No dysarthria or facial asymmetry.  Psychiatric:        Mood and Affect: Mood normal.        Speech: Speech normal.        Behavior: Behavior normal.  Thought Content: Thought content normal.        Judgment: Judgment normal.      UC Treatments / Results  Labs (all labs  ordered are listed, but only abnormal results are displayed) Labs Reviewed - No data to display  EKG   Radiology No results found.  Procedures Procedures (including critical care time)  Medications Ordered in UC Medications - No data to display  Initial Impression / Assessment and Plan / UC Course  I have reviewed the triage vital signs and the nursing notes.  Pertinent labs & imaging results that were available during my care of the patient were reviewed by me and considered in my medical decision making (see chart for details).   Viral URI with cough Symptoms and physical exam consistent with a viral upper respiratory tract infection that will likely resolve with rest, fluids, and prescriptions for symptomatic relief. No indication for imaging today based on stable cardiopulmonary exam and hemodynamically stable vital signs.   Tessalon pearles sent to pharmacy for symptomatic relief to be taken as prescribed. May use OTC mucinex as well for symptomatic improvement for cough and nasal congestion. May use ibuprofen/tylenol over the counter for body aches, fever/chills, and overall discomfort associated with viral illness. Nonpharmacologic interventions for symptom relief provided and after visit summary below.   2. Bilateral cerumen impaction Nursing staff provided ear lavage. Patient reports improvement in ear fullness after ear lavage.  Strict ED/urgent care return precautions given.  Patient verbalizes understanding and agreement with plan.  Counseled patient regarding possible side effects and uses of all medications prescribed at today's visit.  Patient verbalizes understanding and agreement with plan.  All questions answered.  Patient discharged from urgent care in stable condition.        Final Clinical Impressions(s) / UC Diagnoses   Final diagnoses:  Viral URI with cough  Nasal congestion  Bilateral impacted cerumen     Discharge Instructions      You have a  viral upper respiratory infection.  COVID-19 testing is pending. We will call you with results if positive. If your COVID test is positive, you must stay at home until day 6 of symptoms. On day 6, you may go out into public and go back to work, but you must wear a mask until day 11 of symptoms to prevent spread to others.  Purchase mucinex (guaifenesin) 1200mg  and take this every 12 hours for the next few days to thin your nasal congestion and mucous so that you are able to get out of your body easier by coughing and blowing your nose. Drink plenty of water while taking this.  Take tessalon pearles every 8 hours as needed for cough.  You may take tylenol 1,000mg  and ibuprofen 600mg  every 6 hours with food as needed for fever/chills, sore throat, aches/pains, and inflammation associated with viral illness. Take this with food to avoid stomach upset.    You may use debrox over the counter to help with ear wax removal. Stop using Q-tips.  You may do salt water and baking soda gargles every 4 hours as needed for your throat pain.  Please put 1 teaspoon of salt and 1/2 teaspoon of baking soda in 8 ounces of warm water then gargle and spit the water out. You may also put 1 tablespoon of honey in warm water and drink this to soothe your throat.  Place a humidifier in your room at night to help decrease dry air that can irritate your airway and  cause you to have a sore throat and cough.  Please try to eat a well-balanced diet while you are sick so that your body gets proper nutrition to heal.  If you develop any new or worsening symptoms, please return.  If your symptoms are severe, please go to the emergency room.  Follow-up with your primary care provider for further evaluation and management of your symptoms as well as ongoing wellness visits.  I hope you feel better!     ED Prescriptions     Medication Sig Dispense Auth. Provider   benzonatate (TESSALON) 100 MG capsule Take 1 capsule (100 mg total)  by mouth every 8 (eight) hours. 21 capsule Carlisle Beers, FNP      PDMP not reviewed this encounter.   Carlisle Beers, [place] 05/12/22 1754

## 2022-05-12 NOTE — ED Triage Notes (Signed)
Wants to rule out flu and needs a note for work. Sore throat started Monday, fever/body aches started Tuesday, cough sneezing runny nose started after that.

## 2022-05-12 NOTE — Discharge Instructions (Addendum)
You have a viral upper respiratory infection.   Purchase mucinex (guaifenesin) 1200mg  and take this every 12 hours for the next few days to thin your nasal congestion and mucous so that you are able to get out of your body easier by coughing and blowing your nose. Drink plenty of water while taking this.  Take tessalon pearles every 8 hours as needed for cough.  You may take tylenol 1,000mg  and ibuprofen 600mg  every 6 hours with food as needed for fever/chills, sore throat, aches/pains, and inflammation associated with viral illness. Take this with food to avoid stomach upset.    You may use debrox over the counter to help with ear wax removal. Stop using Q-tips.  You may do salt water and baking soda gargles every 4 hours as needed for your throat pain.  Please put 1 teaspoon of salt and 1/2 teaspoon of baking soda in 8 ounces of warm water then gargle and spit the water out. You may also put 1 tablespoon of honey in warm water and drink this to soothe your throat.  Place a humidifier in your room at night to help decrease dry air that can irritate your airway and cause you to have a sore throat and cough.  Please try to eat a well-balanced diet while you are sick so that your body gets proper nutrition to heal.  If you develop any new or worsening symptoms, please return.  If your symptoms are severe, please go to the emergency room.  Follow-up with your primary care provider for further evaluation and management of your symptoms as well as ongoing wellness visits.  I hope you feel better!

## 2022-08-21 ENCOUNTER — Ambulatory Visit: Payer: Self-pay

## 2022-08-23 ENCOUNTER — Ambulatory Visit: Payer: Self-pay

## 2022-08-23 ENCOUNTER — Telehealth: Payer: Self-pay

## 2022-08-23 ENCOUNTER — Ambulatory Visit
Admission: RE | Admit: 2022-08-23 | Discharge: 2022-08-23 | Disposition: A | Discharge status: Home / Self Care | Payer: BLUE CROSS/BLUE SHIELD | Source: Ambulatory Visit | Attending: Urgent Care | Admitting: Urgent Care

## 2022-08-23 VITALS — BP 116/75 | HR 66 | Temp 98.9°F | Resp 16

## 2022-08-23 DIAGNOSIS — Z7251 High risk heterosexual behavior: Secondary | ICD-10-CM | POA: Insufficient documentation

## 2022-08-23 DIAGNOSIS — H6123 Impacted cerumen, bilateral: Secondary | ICD-10-CM | POA: Insufficient documentation

## 2022-08-23 DIAGNOSIS — N898 Other specified noninflammatory disorders of vagina: Secondary | ICD-10-CM

## 2022-08-23 MED ORDER — CARBAMIDE PEROXIDE 6.5 % OT SOLN: 5.0000 [drp] | Freq: Every day | OTIC | 0 refills | Status: DC | PRN

## 2022-08-23 NOTE — Discharge Instructions (Addendum)
I will update you on your test results and any treatment that you might need based off positive results. I highly recommend that you practice safe sex including using condoms to help avoid contracting sexually transmitted infections.   You can use carbamide peroxide for excessive ear wax build up.

## 2022-08-23 NOTE — ED Provider Notes (Signed)
[facility]  Note:  This document was prepared using Systems analyst and may include unintentional dictation errors.  MRN: [id number] DOB: 1990/06/02  Subjective:   Alyssa Long is a 32 y.o. female presenting for 2 day history of vaginal discharge. Patient has unprotected sex with 1 female partner. He is not exclusive, sees prostitutes does not use protection during oral sex per patient. Denies fever, n/v, abdominal pain, pelvic pain, rashes, dysuria, urinary frequency, hematuria.  Does not have concern for pregnancy.  She would like to get HIV and syphilis testing as well.  Patient history of a history of bilateral earwax buildup.  Reports a history of issues with this.  Does not use Q-tips.  Has not tried medicines.  No pain, tinnitus, dizziness.  No chronic medications.    Allergies  Allergen Reactions   Depo-Provera [Medroxyprogesterone Acetate] Other (See Comments)    Caused emotional distress/depression   Shellfish Allergy     hives    Past Medical History:  Diagnosis Date   Headache(784.0)    Vaginal Pap smear, abnormal      Past Surgical History:  Procedure Laterality Date   COLPOSCOPY      Family History  Problem Relation Age of Onset   Diabetes Maternal Grandmother     Social History   Tobacco Use   Smoking status: Never   Smokeless tobacco: Never  Substance Use Topics   Alcohol use: Yes    Alcohol/week: 1.0 standard drink of alcohol    Types: 1 Glasses of wine per week   Drug use: No    ROS   Objective:   Vitals: BP 116/75 (BP Location: Right Arm)   Pulse 66   Temp 98.9 F (37.2 C) (Oral)   Resp 16   LMP 08/04/2022 (Exact Date)   SpO2 96%   Physical Exam Constitutional:      General: She is not in acute distress.    Appearance: Normal appearance. She is well-developed. She is not ill-appearing, toxic-appearing or diaphoretic.  HENT:     Head: Normocephalic and atraumatic.     Right Ear:  Tympanic membrane and external ear normal. No tenderness. There is no impacted cerumen. Tympanic membrane is not injected, perforated, erythematous or bulging.     Left Ear: Tympanic membrane and external ear normal. No tenderness. There is no impacted cerumen. Tympanic membrane is not injected, perforated, erythematous or bulging.     Ears:     Comments: Ceruminous ear canals but TMs are visualized and without any sign of infection.    Nose: Nose normal.     Mouth/Throat:     Mouth: Mucous membranes are moist.  Eyes:     General: No scleral icterus.       Right eye: No discharge.        Left eye: No discharge.     Extraocular Movements: Extraocular movements intact.     Conjunctiva/sclera: Conjunctivae normal.  Cardiovascular:     Rate and Rhythm: Normal rate.  Pulmonary:     Effort: Pulmonary effort is normal.  Abdominal:     General: Bowel sounds are normal. There is no distension.     Palpations: Abdomen is soft. There is no mass.     Tenderness: There is no abdominal tenderness. There is no right CVA tenderness, left CVA tenderness, guarding or rebound.  Skin:    General: Skin is warm and dry.  Neurological:     General: No focal deficit present.  Mental Status: She is alert and oriented to person, place, and time.  Psychiatric:        Mood and Affect: Mood normal.        Behavior: Behavior normal.        Thought Content: Thought content normal.        Judgment: Judgment normal.     Assessment and Plan :   PDMP not reviewed this encounter.  1. Vaginal discharge   2. Unprotected sex   3. Excessive cerumen in both ear canals     Patient declined UPT.  She is considered high risk given that her sex partner has multiple partners, has unprotected oral sex with prostitutes.  As such, patient was agreeable to complete STI check except for an anal swab as she does not have any receptive anal sex.  Otherwise no signs of cerumen impaction, otitis media.  Discussed general  management of excessive cerumen in ear canals. Counseled patient on potential for adverse effects with medications prescribed/recommended today, ER and return-to-clinic precautions discussed, patient verbalized understanding.    Jaynee Eagles, [place] 08/23/22 1345

## 2022-08-23 NOTE — ED Triage Notes (Signed)
Pt reports green vaginal discharge x 2 days.   Pt reports bilateral hearing decrease 3 days.

## 2022-08-24 LAB — HIV ANTIBODY (ROUTINE TESTING W REFLEX): HIV Screen 4th Generation wRfx: NONREACTIVE

## 2022-08-24 LAB — CERVICOVAGINAL ANCILLARY ONLY
Bacterial Vaginitis (gardnerella): POSITIVE — AB
Candida Glabrata: NEGATIVE
Candida Vaginitis: POSITIVE — AB
Chlamydia: NEGATIVE
Comment: NEGATIVE
Comment: NEGATIVE
Comment: NEGATIVE
Comment: NEGATIVE
Comment: NEGATIVE
Comment: NORMAL
Neisseria Gonorrhea: NEGATIVE
Trichomonas: NEGATIVE

## 2022-08-24 LAB — RPR: RPR Ser Ql: NONREACTIVE

## 2022-08-25 LAB — CYTOLOGY, (ORAL, ANAL, URETHRAL) ANCILLARY ONLY
Chlamydia: NEGATIVE
Comment: NEGATIVE
Comment: NEGATIVE
Comment: NORMAL
Neisseria Gonorrhea: NEGATIVE
Trichomonas: NEGATIVE

## 2022-08-28 ENCOUNTER — Telehealth (HOSPITAL_COMMUNITY): Payer: Self-pay | Admitting: Emergency Medicine

## 2022-08-28 MED ORDER — FLUCONAZOLE 150 MG PO TABS: 150.0000 mg | ORAL_TABLET | Freq: Once | ORAL | 0 refills | Status: AC

## 2022-08-28 MED ORDER — METRONIDAZOLE 0.75 % VA GEL: 1.0000 | Freq: Every day | VAGINAL | 0 refills | Status: AC

## 2022-08-30 ENCOUNTER — Ambulatory Visit: Payer: Self-pay

## 2022-09-08 ENCOUNTER — Encounter: Payer: Self-pay | Admitting: Radiology

## 2022-10-20 ENCOUNTER — Ambulatory Visit: Payer: BLUE CROSS/BLUE SHIELD

## 2022-11-16 ENCOUNTER — Ambulatory Visit: Payer: Self-pay | Admitting: Nurse Practitioner

## 2023-02-24 ENCOUNTER — Encounter (HOSPITAL_COMMUNITY): Payer: Self-pay

## 2023-02-24 ENCOUNTER — Other Ambulatory Visit: Payer: Self-pay

## 2023-02-24 ENCOUNTER — Emergency Department (HOSPITAL_COMMUNITY)
Admission: EM | Admit: 2023-02-24 | Discharge: 2023-02-24 | Disposition: A | Discharge status: Home / Self Care | Payer: BLUE CROSS/BLUE SHIELD

## 2023-02-24 DIAGNOSIS — Z30012 Encounter for prescription of emergency contraception: Secondary | ICD-10-CM | POA: Diagnosis not present

## 2023-02-24 DIAGNOSIS — Z7251 High risk heterosexual behavior: Secondary | ICD-10-CM | POA: Diagnosis present

## 2023-02-24 MED ORDER — ELLA 30 MG PO TABS: 1.0000 | ORAL_TABLET | Freq: Once | ORAL | 0 refills | Status: AC

## 2023-02-24 NOTE — ED Triage Notes (Signed)
Pt requesting ELLA (emergency contraceptive) states she cannot take plan B because she is ovulating and was told to come here.

## 2023-02-24 NOTE — MAU Note (Signed)
.  Alyssa Long is a 32 y.o. at Unknown here in MAU reporting: here from main ED - reports she is ovulating and had unprotected sex last night. States she is unable to take plan B and was told to come get ELLA. Denies pain or VB.  LMP: 02/16/2023 Onset of complaint:  Pain score: 0 Vitals:   02/24/23 1841 02/24/23 2200  BP: 123/84 (!) 104/55  Pulse: 72 89  Resp: 14 16  Temp: 98.8 F (37.1 C) 98.5 F (36.9 C)  SpO2: 99% 100%     FHT:NA Lab orders placed from triage:  none

## 2023-02-24 NOTE — MAU Provider Note (Signed)
  History     CSN: [id number]  Arrival date and time: 02/24/23 1812  S  Alyssa Long is a 32 y.o.  African American Female who is not pregnant.  She presents today for prescription for emergency contraception.  She states she had UPSI last night.  She reports she is scheduled to ovulate on the 9th and desires ELLA to stop ovulation.    O BP 112/68 (BP Location: Right Arm)   Pulse 66   Temp 98.1 F (36.7 C) (Oral)   Resp 18   Ht 5\' 3"  (1.6 m)   Wt 73.3 kg   SpO2 99%   BMI 28.64 kg/m  Physical Exam Vitals reviewed.  Constitutional:      Appearance: Normal appearance.  HENT:     Head: Normocephalic and atraumatic.  Eyes:     Conjunctiva/sclera: Conjunctivae normal.  Cardiovascular:     Rate and Rhythm: Normal rate.  Pulmonary:     Effort: Pulmonary effort is normal. No respiratory distress.  Musculoskeletal:        General: Normal range of motion.     Cervical back: Normal range of motion.  Neurological:     Mental Status: She is alert.  Psychiatric:        Attention and Perception: Attention normal.        Mood and Affect: Mood is anxious.     A Medical screening exam complete Recent UPSI  P -Discussed emergency contraception and side effects. -Addressed questions and concerns regarding changes in menstruation after administration. -Encouraged to take UPT next month if no menses occurs. -Rx for [facility] sent to pharmacy of choice. -Patient instructed to take medication tonight. -Discharge to home in stable condition.   Cherre Robins MSN, CNM Advanced Practice Provider, [facility] for [facility] 02/24/2023, 11:16 PM

## 2023-02-24 NOTE — ED Notes (Signed)
Rn called MAU and spoke with provider, Shanda Bumps. She will accept pt. Pt walked to MAU at this time.

## 2023-08-08 ENCOUNTER — Emergency Department (HOSPITAL_COMMUNITY)
Admission: EM | Admit: 2023-08-08 | Discharge: 2023-08-09 | Discharge status: Against Medical Advice | Attending: Emergency Medicine | Admitting: Emergency Medicine

## 2023-08-08 DIAGNOSIS — T7621XA Adult sexual abuse, suspected, initial encounter: Secondary | ICD-10-CM | POA: Diagnosis present

## 2023-08-08 DIAGNOSIS — Z5321 Procedure and treatment not carried out due to patient leaving prior to being seen by health care provider: Secondary | ICD-10-CM | POA: Insufficient documentation

## 2023-08-08 NOTE — ED Notes (Signed)
 Pt called twice from triage with no answer, triage RN notified
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
## 2023-08-28 ENCOUNTER — Encounter (HOSPITAL_COMMUNITY): Payer: Self-pay | Admitting: *Deleted

## 2023-08-28 ENCOUNTER — Other Ambulatory Visit: Payer: Self-pay

## 2023-08-28 ENCOUNTER — Ambulatory Visit (HOSPITAL_COMMUNITY)
Admission: EM | Admit: 2023-08-28 | Discharge: 2023-08-28 | Disposition: A | Discharge status: Home / Self Care | Attending: Emergency Medicine | Admitting: Emergency Medicine

## 2023-08-28 DIAGNOSIS — H01022 Squamous blepharitis right lower eyelid: Secondary | ICD-10-CM

## 2023-08-28 MED ORDER — ERYTHROMYCIN 5 MG/GM OP OINT: TOPICAL_OINTMENT | OPHTHALMIC | 0 refills | Status: AC

## 2023-08-28 NOTE — ED Triage Notes (Signed)
 C/O "stye" to right outer corner of eye x approx 5 days. Denies any vision changes.

## 2023-08-28 NOTE — Discharge Instructions (Addendum)
 Use erythromycin ointment 4 times daily for 5 days. You can also apply warm compresses to help with swelling. Return here if symptoms persist or worsen.

## 2023-08-28 NOTE — ED Provider Notes (Signed)
 [facility]    CSN: [id number] Arrival date & time: 08/28/23  1610      History   Chief Complaint Chief Complaint  Patient presents with   Eye Problem    HPI Alyssa Long is a 33 y.o. female.   Patient presents with right eyelid pain and swelling x 5 days.  Patient denies vision changes.   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 Authorizing Provider  erythromycin ophthalmic ointment Place a 1/2 inch ribbon of ointment into the lower eyelid. 08/28/23  Yes Letta Kocher, NP    Family History Family History  Problem Relation Age of Onset   Diabetes Maternal Grandmother     Social History Social History   Tobacco Use   Smoking status: Never   Smokeless tobacco: Never  Vaping Use   Vaping status: Never Used  Substance Use Topics   Alcohol use: Yes    Comment: occasionally   Drug use: No     Allergies   Depo-provera [medroxyprogesterone acetate] and Shellfish allergy   Review of Systems Review of Systems  Per HPI  Physical Exam Triage Vital Signs ED Triage Vitals  Encounter Vitals Group     BP 08/28/23 1628 122/78     Systolic BP Percentile --      Diastolic BP Percentile --      Pulse Rate 08/28/23 1628 60     Resp 08/28/23 1628 16     Temp 08/28/23 1628 98 F (36.7 C)     Temp Source 08/28/23 1628 Oral     SpO2  08/28/23 1628 98 %     Weight --      Height --      Head Circumference --      Peak Flow --      Pain Score 08/28/23 1629 7     Pain Loc --      Pain Education --      Exclude from Growth Chart --    No data found.  Updated Vital Signs BP 122/78   Pulse 60   Temp 98 F (36.7 C) (Oral)   Resp 16   LMP 08/25/2023 (Exact Date)   SpO2 98%   Visual Acuity Right Eye Distance:   Left Eye Distance:   Bilateral Distance:    Right Eye Near:   Left Eye Near:    Bilateral Near:     Physical Exam Vitals and nursing note reviewed.  Constitutional:      General: She is awake. She is not  in acute distress.    Appearance: Normal appearance. She is well-developed and well-groomed. She is not ill-appearing.  Eyes:     Conjunctiva/sclera: Conjunctivae normal.     Comments: Right lower eyelid swelling and tenderness  Neurological:     Mental Status: She is alert.  Psychiatric:        Behavior: Behavior is cooperative.      UC Treatments / Results  Labs (all labs ordered are listed, but only abnormal results are displayed) Labs Reviewed - No data to display  EKG   Radiology No results found.  Procedures Procedures (including critical care time)  Medications Ordered in UC Medications - No data to display  Initial Impression / Assessment and Plan / UC Course  I have reviewed the triage vital signs and the nursing notes.  Pertinent labs & imaging results that were available during my care of the patient were reviewed by me and considered in my medical decision making (see chart for details).     Right lower eyelid swelling and tenderness noted on exam blepharitis.  Prescribed erythromycin ointment.  Discussed warm compresses.  Discussed return precautions. Final Clinical Impressions(s) / UC Diagnoses   Final diagnoses:  Squamous blepharitis of right lower eyelid     Discharge Instructions      Use erythromycin ointment 4 times daily for 5 days. You can also  apply warm compresses to help with swelling. Return here if symptoms persist or worsen.    ED Prescriptions     Medication Sig Dispense Auth. Provider   erythromycin ophthalmic ointment Place a 1/2 inch ribbon of ointment into the lower eyelid. 3.5 g Wynonia Lawman A, NP      PDMP not reviewed this encounter.   Wynonia Lawman A, NP 08/28/23 1723
